# Patient Record
Sex: Female | Born: 1965 | Race: White | Hispanic: No | Marital: Married | Smoking: Former smoker
Health system: Southern US, Community
[De-identification: ages and names within clinical notes are randomized; demographics above are authoritative.]

## PROBLEM LIST (undated history)

## (undated) DIAGNOSIS — J45909 Unspecified asthma, uncomplicated: Secondary | ICD-10-CM

## (undated) DIAGNOSIS — E119 Type 2 diabetes mellitus without complications: Secondary | ICD-10-CM

## (undated) DIAGNOSIS — K219 Gastro-esophageal reflux disease without esophagitis: Secondary | ICD-10-CM

## (undated) DIAGNOSIS — G43909 Migraine, unspecified, not intractable, without status migrainosus: Secondary | ICD-10-CM

## (undated) DIAGNOSIS — G4730 Sleep apnea, unspecified: Secondary | ICD-10-CM

## (undated) DIAGNOSIS — N809 Endometriosis, unspecified: Secondary | ICD-10-CM

## (undated) DIAGNOSIS — I10 Essential (primary) hypertension: Secondary | ICD-10-CM

## (undated) DIAGNOSIS — D649 Anemia, unspecified: Secondary | ICD-10-CM

---
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
## 1994-10-15 HISTORY — PX: TUBAL LIGATION: SHX77

## 2003-10-16 HISTORY — PX: CHOLECYSTECTOMY: SHX55

## 2004-03-02 ENCOUNTER — Other Ambulatory Visit: Payer: Self-pay

## 2005-03-01 ENCOUNTER — Ambulatory Visit: Payer: Self-pay

## 2005-05-17 ENCOUNTER — Emergency Department: Payer: Self-pay | Admitting: Emergency Medicine

## 2005-06-28 ENCOUNTER — Ambulatory Visit: Payer: Self-pay

## 2009-01-02 ENCOUNTER — Ambulatory Visit: Payer: Self-pay | Admitting: Internal Medicine

## 2011-11-07 ENCOUNTER — Ambulatory Visit: Payer: Self-pay

## 2011-11-07 LAB — RAPID STREP-A WITH REFLX: Micro Text Report: NEGATIVE

## 2011-11-09 LAB — BETA STREP CULTURE(ARMC)

## 2012-12-13 ENCOUNTER — Ambulatory Visit: Payer: Self-pay | Admitting: Family Medicine

## 2013-10-15 HISTORY — PX: LAPAROSCOPIC GASTRIC BANDING: SHX1100

## 2014-02-25 ENCOUNTER — Ambulatory Visit: Payer: Self-pay | Admitting: Specialist

## 2015-01-11 IMAGING — CR DG CHEST 2V
1 series · 2 of 2 positions shown · non-contrast
Comparison: None.

CLINICAL DATA: Bariatric workup, obesity

EXAM:
CHEST  2 VIEW

[Series 2: w chest pa · 0.14mm/px · 2 of 2 slices shown]
[im 1/2]
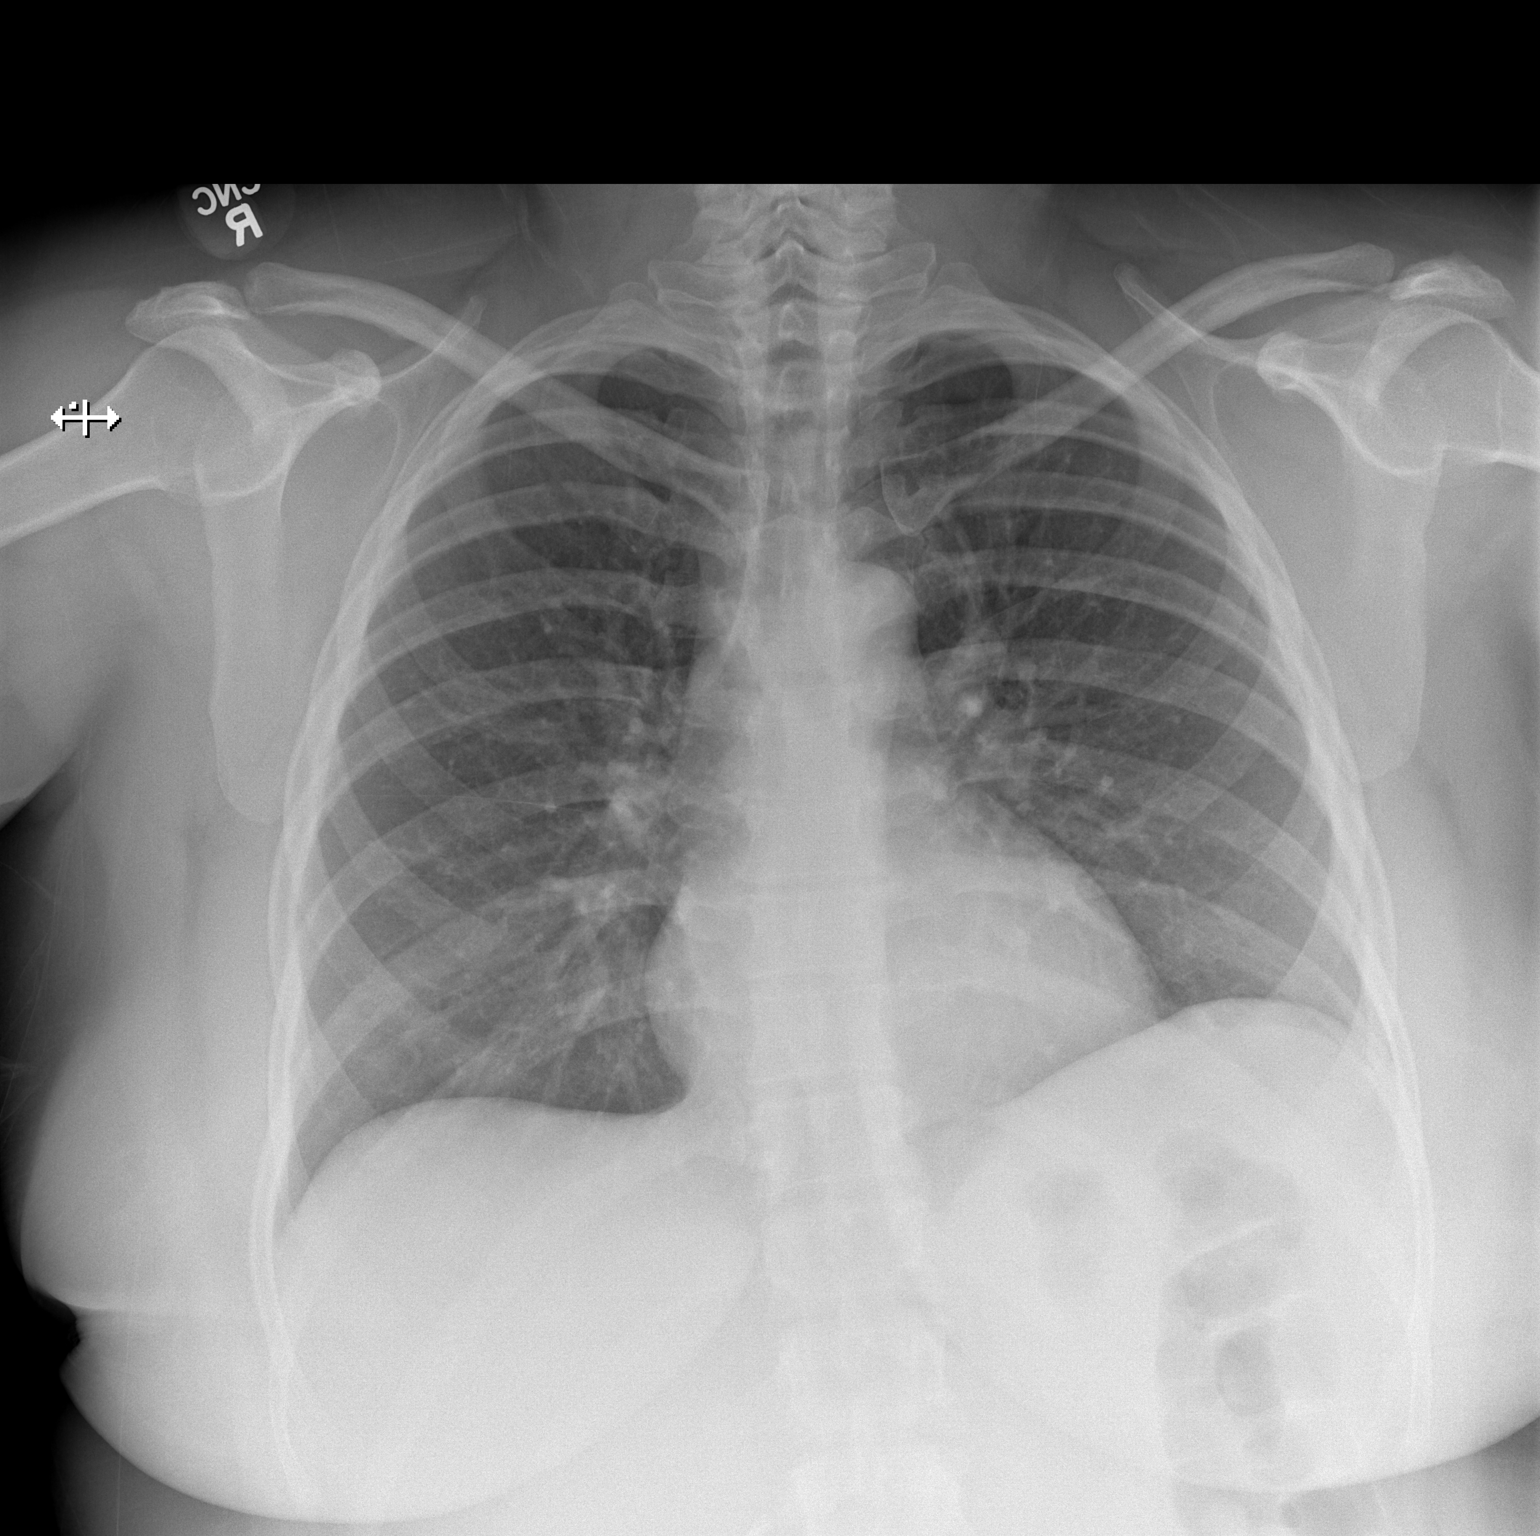
[im 2/2]
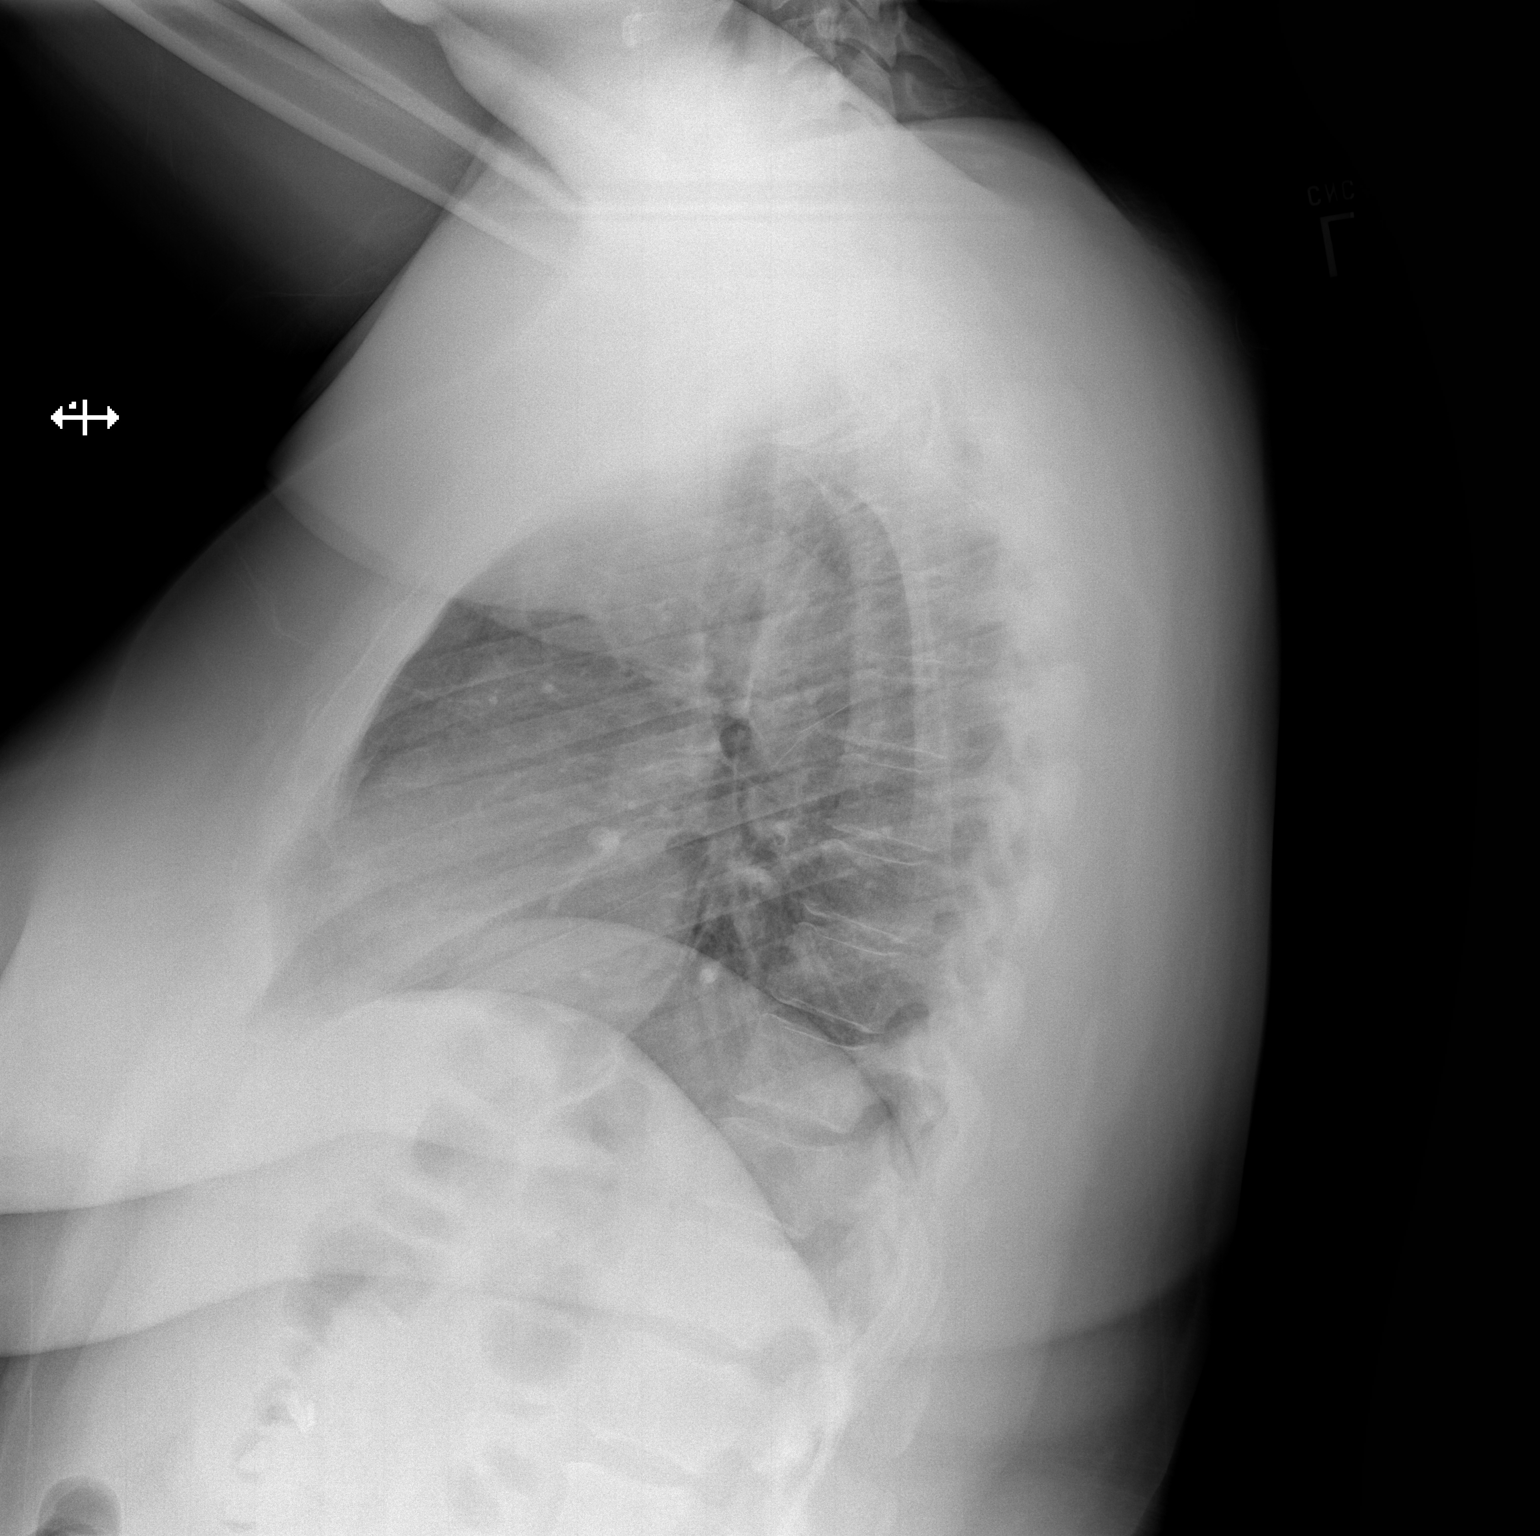

[2 of 2 positions shown; findings below may reference images not displayed]

FINDINGS: Cardiomediastinal silhouette is unremarkable. No acute infiltrate or
pleural effusion. No pulmonary edema. Bony thorax is unremarkable.
IMPRESSION: No active cardiopulmonary disease.

## 2016-03-31 ENCOUNTER — Ambulatory Visit
Admission: EM | Admit: 2016-03-31 | Discharge: 2016-03-31 | Disposition: A | Discharge status: Home / Self Care | Payer: BLUE CROSS/BLUE SHIELD | Attending: Family Medicine | Admitting: Family Medicine

## 2016-03-31 ENCOUNTER — Encounter: Payer: Self-pay | Admitting: *Deleted

## 2016-03-31 DIAGNOSIS — N390 Urinary tract infection, site not specified: Secondary | ICD-10-CM

## 2016-03-31 DIAGNOSIS — R319 Hematuria, unspecified: Secondary | ICD-10-CM | POA: Diagnosis not present

## 2016-03-31 HISTORY — DX: Type 2 diabetes mellitus without complications: E11.9

## 2016-03-31 HISTORY — DX: Endometriosis, unspecified: N80.9

## 2016-03-31 HISTORY — DX: Sleep apnea, unspecified: G47.30

## 2016-03-31 HISTORY — DX: Anemia, unspecified: D64.9

## 2016-03-31 HISTORY — DX: Gastro-esophageal reflux disease without esophagitis: K21.9

## 2016-03-31 HISTORY — DX: Unspecified asthma, uncomplicated: J45.909

## 2016-03-31 HISTORY — DX: Migraine, unspecified, not intractable, without status migrainosus: G43.909

## 2016-03-31 HISTORY — DX: Essential (primary) hypertension: I10

## 2016-03-31 LAB — PREGNANCY, URINE: Preg Test, Ur: NEGATIVE

## 2016-03-31 LAB — URINALYSIS COMPLETE WITH MICROSCOPIC (ARMC ONLY)
BILIRUBIN URINE: NEGATIVE
GLUCOSE, UA: NEGATIVE mg/dL
Hgb urine dipstick: NEGATIVE
Ketones, ur: NEGATIVE mg/dL
Leukocytes, UA: NEGATIVE
Nitrite: POSITIVE — AB
Protein, ur: NEGATIVE mg/dL
Specific Gravity, Urine: 1.015 (ref 1.005–1.030)
pH: 6 (ref 5.0–8.0)

## 2016-03-31 MED ORDER — NITROFURANTOIN MONOHYD MACRO 100 MG PO CAPS: 100.0000 mg | ORAL_CAPSULE | Freq: Two times a day (BID) | ORAL | Status: DC

## 2016-03-31 NOTE — ED Notes (Signed)
Patient started having symptoms burning while urinating 3 days ago and this AM blood in urine was observed. Patient does have a history of UTI.

## 2016-03-31 NOTE — ED Provider Notes (Signed)
CSN: [id number]     Arrival date & time 03/31/16  1355 History   First MD Initiated Contact with Patient 03/31/16 1441     Chief Complaint  Patient presents with  . Recurrent UTI   (Consider location/radiation/quality/duration/timing/severity/associated sxs/prior Treatment) HPI: She presents today with symptoms of dysuria for the last 3 days. She states that she was trying to treat her symptoms with cranberry juice and Azo however her symptoms got worse yesterday. She did notice blood in her urine yesterday. She does have a history of UTIs. She also has had a history of a kidney stone a few years ago. She denies any vaginal bleeding or discharge. Her last menstrual period was June 1. She denies any fever, nausea or vomiting.  Past Medical History  Diagnosis Date  . GERD (gastroesophageal reflux disease)   . Hypertension   . Diabetes mellitus without complication (HCC)   . Anemia   . Sleep apnea   . Migraine   . Asthma   . Endometriosis    Past Surgical History  Procedure Laterality Date  . Laparoscopic gastric banding     Family History  Problem Relation Age of Onset  . Rheum arthritis Mother   . Diabetes Father   . Hypertension Father    Social History  Substance Use Topics  . Smoking status: Never Smoker   . Smokeless tobacco: Never Used  . Alcohol Use: Yes   OB History    No data available     Review of Systems: Negative except mentioned above.  Allergies  Avelox; Penicillins; and Sulfa antibiotics  Home Medications   Prior to Admission medications   Medication Sig Start Date End Date Taking? Authorizing Provider  albuterol (PROVENTIL HFA;VENTOLIN HFA) 108 (90 Base) MCG/ACT inhaler Inhale 1 puff into the lungs every 6 (six) hours as needed for wheezing or shortness of breath.   Yes Historical Provider, MD  budesonide (RHINOCORT ALLERGY) 32 MCG/ACT nasal spray Place 1 spray into both nostrils daily.   Yes Historical Provider, MD  Cholecalciferol (VITAMIN D PO)  Take 1 tablet by mouth daily.   Yes Historical Provider, MD  esomeprazole (NEXIUM) 20 MG capsule Take 20 mg by mouth daily at 12 noon.   Yes Historical Provider, MD  Multiple Vitamin (MULTIVITAMIN) tablet Take 1 tablet by mouth daily.   Yes Historical Provider, MD   Meds Ordered and Administered this Visit  Medications - No data to display  BP 136/70 mmHg  Pulse 99  Temp(Src) 98.3 F (36.8 C)  Resp 18  Ht 5\' 4"  (1.626 m)  Wt 190 lb (86.183 kg)  BMI 32.60 kg/m2  SpO2 99%  LMP 03/15/2016 No data found.   Physical Exam   GENERAL: NAD HEENT: no pharyngeal erythema, no exudate RESP: CTA B CARD: RRR ABD: +BS, mild suprapubic tenderness, minimal right flank tenderness, no rebound or guarding  NEURO: CN II-XII grossly intact   ED Course  Procedures (including critical care time)  Labs Review Labs Reviewed  URINALYSIS COMPLETEWITH MICROSCOPIC [facility] ONLY)    Imaging Review No results found.     MDM  A/P: UTI- symptoms are suggestive of UTI, UA only shows positive nitrites, however patient has described hematuria that she has noticed. We'll send urine for culture. Urine pregnancy was negative. I have discussed with the patient that there is a possibility that she could've passed a kidney stone. If her symptoms do persist or worsen as described to her she should go to the ER.  Jolene Provost, MD 03/31/16 [phone]

## 2016-05-06 ENCOUNTER — Ambulatory Visit
Admission: EM | Admit: 2016-05-06 | Discharge: 2016-05-06 | Disposition: A | Discharge status: Home / Self Care | Payer: BLUE CROSS/BLUE SHIELD | Attending: Family Medicine | Admitting: Family Medicine

## 2016-05-06 DIAGNOSIS — M25551 Pain in right hip: Secondary | ICD-10-CM | POA: Diagnosis not present

## 2016-05-06 DIAGNOSIS — M76891 Other specified enthesopathies of right lower limb, excluding foot: Secondary | ICD-10-CM

## 2016-05-06 NOTE — ED Triage Notes (Signed)
Patient states that she has right hip pain with a palpable knot on her hip. Patient states that she noticed the knot last night when she was stretching. Patient states that the pain has been constant for 1 year, worsening over the last 3-4 days.

## 2016-05-06 NOTE — ED Provider Notes (Signed)
[facility]    CSN: [id number] Arrival date & time: 05/06/16  1415  First Provider Contact:  First MD Initiated Contact with Patient 05/06/16 1512        History   Chief Complaint Chief Complaint  Patient presents with  . Hip Pain    HPI Donna Collier is a 50 y.o. female.   50 yo female with a  1 year h/o intermittent right hip pain presents with worsening over the last week. Denies any injuries, fall, trauma,  swelling, fevers, chills, bowel or bladder problems. States pain occasionally radiates into the right groin area. Worse with movement intermittently.    The history is provided by the patient.  Hip Pain  This is a chronic problem. The current episode started more than 1 week ago. The problem has been gradually worsening. Pertinent negatives include no chest pain, no abdominal pain and no shortness of breath. The symptoms are aggravated by walking. The symptoms are relieved by rest, ice and NSAIDs.    Past Medical History:  Diagnosis Date  . Anemia   . Asthma   . Diabetes mellitus without complication (HCC)   . Endometriosis   . GERD (gastroesophageal reflux disease)   . Hypertension   . Migraine   . Sleep apnea     There are no active problems to display for this patient.   Past Surgical History:  Procedure Laterality Date  . APPENDECTOMY  1974  . CESAREAN SECTION  1991  . CHOLECYSTECTOMY  2005  . LAPAROSCOPIC GASTRIC BANDING  2015  . TUBAL LIGATION  1996    OB History    No data available       Home Medications    Prior to Admission medications   Medication Sig Start Date End Date Taking? Authorizing Provider  albuterol (PROVENTIL HFA;VENTOLIN HFA) 108 (90 Base) MCG/ACT inhaler Inhale 1 puff into the lungs every 6 (six) hours as needed for wheezing or shortness of breath.   Yes Historical Provider, MD  Biotin 10 MG CAPS Take by mouth.   Yes Historical Provider, MD  budesonide (RHINOCORT ALLERGY) 32 MCG/ACT nasal spray Place 1 spray  into both nostrils daily.   Yes Historical Provider, MD  Cholecalciferol (VITAMIN D PO) Take 1 tablet by mouth daily.   Yes Historical Provider, MD  esomeprazole (NEXIUM) 20 MG capsule Take 20 mg by mouth daily at 12 noon.   Yes Historical Provider, MD  Multiple Vitamin (MULTIVITAMIN) tablet Take 1 tablet by mouth daily.   Yes Historical Provider, MD  vitamin B-12 (CYANOCOBALAMIN) 1000 MCG tablet Take 1,000 mcg by mouth daily.   Yes Historical Provider, MD    Family History Family History  Problem Relation Age of Onset  . Rheum arthritis Mother   . Diabetes Father   . Hypertension Father     Social History Social History  Substance Use Topics  . Smoking status: Never Smoker  . Smokeless tobacco: Never Used  . Alcohol use Yes     Allergies   Avelox [moxifloxacin]; Penicillins; Septra [sulfamethoxazole-trimethoprim]; and Sulfa antibiotics   Review of Systems Review of Systems  Respiratory: Negative for shortness of breath.   Cardiovascular: Negative for chest pain.  Gastrointestinal: Negative for abdominal pain.     Physical Exam Triage Vital Signs ED Triage Vitals  Enc Vitals Group     BP 05/06/16 1434 130/86     Pulse Rate 05/06/16 1434 94     Resp 05/06/16 1434 16     Temp  05/06/16 1434 99.6 F (37.6 C)     Temp Source 05/06/16 1434 Tympanic     SpO2 05/06/16 1434 100 %     Weight 05/06/16 1434 193 lb (87.5 kg)     Height 05/06/16 1434 5\' 4"  (1.626 m)     Head Circumference --      Peak Flow --      Pain Score 05/06/16 1439 8     Pain Loc --      Pain Edu? --      Excl. in GC? --    No data found.   Updated Vital Signs BP 130/86 (BP Location: Left Arm)   Pulse 94   Temp 99.6 F (37.6 C) (Tympanic)   Resp 16   Ht 5\' 4"  (1.626 m)   Wt 193 lb (87.5 kg)   LMP 04/16/2016 (Approximate)   SpO2 100%   BMI 33.13 kg/m   Visual Acuity Right Eye Distance:   Left Eye Distance:   Bilateral Distance:    Right Eye Near:   Left Eye Near:    Bilateral  Near:     Physical Exam  Constitutional: She appears well-developed and well-nourished. No distress.  Musculoskeletal: Normal range of motion.       Right hip: She exhibits tenderness (over the tensor fasciae latae muscle) and bony tenderness (over the anterior superior iliac spine). She exhibits normal range of motion, normal strength, no swelling, no crepitus, no deformity and no laceration.       Legs: Skin: She is not diaphoretic.  Nursing note and vitals reviewed.    UC Treatments / Results  Labs (all labs ordered are listed, but only abnormal results are displayed) Labs Reviewed - No data to display  EKG  EKG Interpretation None       Radiology No results found.  Procedures Procedures (including critical care time)  Medications Ordered in UC Medications - No data to display   Initial Impression / Assessment and Plan / UC Course  I have reviewed the triage vital signs and the nursing notes.  Pertinent labs & imaging results that were available during my care of the patient were reviewed by me and considered in my medical decision making (see chart for details).  Clinical Course      Final Clinical Impressions(s) / UC Diagnoses   Final diagnoses:  Hip pain, right  Hip tendonitis, right    New Prescriptions Discharge Medication List as of 05/06/2016  3:34 PM     1. diagnosis reviewed with patient 2. Recommend supportive treatment with otc nsaids, stretching, ice 3. Discussed with patient may benefit from physical therapy and recommended patient discuss this with her PCP 4. Follow-up prn if symptoms worsen or don't improve   Payton Mccallum, MD 05/06/16 1750

## 2016-09-17 ENCOUNTER — Ambulatory Visit
Admission: EM | Admit: 2016-09-17 | Discharge: 2016-09-17 | Disposition: A | Discharge status: Home / Self Care | Payer: BLUE CROSS/BLUE SHIELD | Attending: Family Medicine | Admitting: Family Medicine

## 2016-09-17 DIAGNOSIS — J40 Bronchitis, not specified as acute or chronic: Secondary | ICD-10-CM

## 2016-09-17 MED ORDER — AZITHROMYCIN 250 MG PO TABS: ORAL_TABLET | ORAL | 0 refills | Status: DC

## 2016-09-17 MED ORDER — PREDNISONE 20 MG PO TABS: 40.0000 mg | ORAL_TABLET | Freq: Every day | ORAL | 0 refills | Status: AC

## 2016-09-17 MED ORDER — ALBUTEROL SULFATE HFA 108 (90 BASE) MCG/ACT IN AERS: 2.0000 | INHALATION_SPRAY | RESPIRATORY_TRACT | 0 refills | Status: DC | PRN

## 2016-09-17 NOTE — ED Provider Notes (Signed)
[facility] ____________________________________________  Time seen: Approximately 7:07 PM  I have reviewed the triage vital signs and the nursing notes.   HISTORY Chief Complaint  Cough  HPI Donna Collier is a 50 y.o. female present for the complaints of 5-6 days of runny nose, nasal congestion, cough and chest congestion. Patient reports she feels like he has chest condition is increased. Patient reports that she has a history of developing bronchitis and pneumonia which prompted her to be evaluated. She reports some chills and body aches. Denies fevers. States feels like she can occasionally hear herself wheeze when coughing. Reports history of asthma and seasonal allergies. Reports some sick contacts recently at work. Denies known sick contacts.  Reports continues to eat and drink well. Denies fevers. Denies chest pain, shortness breath, chest pain with deep breath, abdominal pain, dysuria, extremity pain or extremity swelling. Denies recent antibiotic use.  Patient's last menstrual period was 09/17/2016. Denies pregnancy.   Past Medical History:  Diagnosis Date  . Anemia   . Asthma   . Diabetes mellitus without complication (HCC)   . Endometriosis   . GERD (gastroesophageal reflux disease)   . Hypertension   . Migraine   . Sleep apnea     There are no active problems to display for this patient.   Past Surgical History:  Procedure Laterality Date  . APPENDECTOMY  1974  . CESAREAN SECTION  1991  . CHOLECYSTECTOMY  2005  . LAPAROSCOPIC GASTRIC BANDING  2015  . TUBAL LIGATION  1996    No current facility-administered medications for this encounter.   Current Outpatient Prescriptions:  .  Biotin 10 MG CAPS, Take by mouth., Disp: , Rfl:  .  budesonide (RHINOCORT ALLERGY) 32 MCG/ACT nasal spray, Place 1 spray into both nostrils daily., Disp: , Rfl:  .  Cholecalciferol (VITAMIN D PO), Take 1 tablet by mouth daily., Disp: , Rfl:  .  esomeprazole (NEXIUM) 20  MG capsule, Take 20 mg by mouth daily at 12 noon., Disp: , Rfl:  .  Multiple Vitamin (MULTIVITAMIN) tablet, Take 1 tablet by mouth daily., Disp: , Rfl:  .  vitamin B-12 (CYANOCOBALAMIN) 1000 MCG tablet, Take 1,000 mcg by mouth daily., Disp: , Rfl:  .  albuterol (PROVENTIL HFA;VENTOLIN HFA) 108 (90 Base) MCG/ACT inhaler, Inhale 2 puffs into the lungs every 4 (four) hours as needed., Disp: 1 Inhaler, Rfl: 0 .  azithromycin (ZITHROMAX Z-PAK) 250 MG tablet, Take 2 tablets (500 mg) on  Day 1,  followed by 1 tablet (250 mg) once daily on Days 2 through 5., Disp: 6 each, Rfl: 0 .  predniSONE (DELTASONE) 20 MG tablet, Take 2 tablets (40 mg total) by mouth daily., Disp: 6 tablet, Rfl: 0  Allergies Avelox [moxifloxacin]; Penicillins; Septra [sulfamethoxazole-trimethoprim]; and Sulfa antibiotics  Family History  Problem Relation Age of Onset  . Rheum arthritis Mother   . Diabetes Father   . Hypertension Father     Social History Social History  Substance Use Topics  . Smoking status: Never Smoker  . Smokeless tobacco: Never Used  . Alcohol use Yes    Review of Systems Constitutional: As above.  Eyes: No visual changes. ENT: Mild sore throat. Cardiovascular: Denies chest pain. Respiratory: Denies shortness of breath. Gastrointestinal: No abdominal pain.  No nausea, no vomiting.  No diarrhea.  No constipation. Genitourinary: Negative for dysuria. Musculoskeletal: Negative for back pain. Skin: Negative for rash. Neurological: Negative for headaches, focal weakness or numbness.  10-point ROS otherwise negative.  ____________________________________________  PHYSICAL EXAM:  VITAL SIGNS: ED Triage Vitals  Enc Vitals Group     BP 09/17/16 1840 (!) 151/91     Pulse Rate 09/17/16 1840 90     Resp 09/17/16 1840 18     Temp 09/17/16 1840 98 F (36.7 C)     Temp Source 09/17/16 1840 Oral     SpO2 09/17/16 1840 99 %     Weight 09/17/16 1839 191 lb (86.6 kg)     Height 09/17/16 1839 5'  4" (1.626 m)     Head Circumference --      Peak Flow --      Pain Score 09/17/16 1841 9     Pain Loc --      Pain Edu? --      Excl. in GC? --    Constitutional: Alert and oriented. Well appearing and in no acute distress. Eyes: Conjunctivae are normal. PERRL. EOMI. Head: Atraumatic. No sinus tenderness to palpation. No swelling. No erythema.  Ears: no erythema, normal TMs bilaterally.   Nose:Nasal congestion with clear rhinorrhea  Mouth/Throat: Mucous membranes are moist. Mild pharyngeal erythema. No tonsillar swelling or exudate.  Neck: No stridor.  No cervical spine tenderness to palpation. Hematological/Lymphatic/Immunilogical: No cervical lymphadenopathy. Cardiovascular: Normal rate, regular rhythm. Grossly normal heart sounds.  Good peripheral circulation. Respiratory: Normal respiratory effort.  No retractions. Lungs CTAB. No wheezes, rales or rhonchi. Good air movement. Intermittent cough noted in room. Mild bronchospasm noted with cough. Gastrointestinal: Soft and nontender.  No CVA tenderness. Musculoskeletal: No lower or upper extremity tenderness nor edema. No cervical, thoracic or lumbar tenderness to palpation. Neurologic:  Normal speech and language. No gross focal neurologic deficits are appreciated. No gait instability. Skin:  Skin is warm, dry and intact. No rash noted. Psychiatric: Mood and affect are normal. Speech and behavior are normal. ________________________________________   LABS (all labs ordered are listed, but only abnormal results are displayed)  Labs Reviewed - No data to display  RADIOLOGY  No results found. ____________________________________________   PROCEDURES Procedures    INITIAL IMPRESSION / ASSESSMENT AND PLAN / ED COURSE  Pertinent labs & imaging results that were available during my care of the patient were reviewed by me and considered in my medical decision making (see chart for details).  Well-appearing patient. No acute  distress. Suspect bronchitis. Will treat patient with oral azithromycin, 3 day course of prednisone, when necessary albuterol inhaler. Patient states that she is cough medicine at home. Encouraged rest, fluids supportive care and PCP follow-up.Discussed indication, risks and benefits of medications with patient.  Discussed follow up with Primary care physician this week. Discussed follow up and return parameters including no resolution or any worsening concerns. Patient verbalized understanding and agreed to plan.   ____________________________________________   FINAL CLINICAL IMPRESSION(S) / ED DIAGNOSES  Final diagnoses:  Bronchitis     Discharge Medication List as of 09/17/2016  7:14 PM    START taking these medications   Details  azithromycin (ZITHROMAX Z-PAK) 250 MG tablet Take 2 tablets (500 mg) on  Day 1,  followed by 1 tablet (250 mg) once daily on Days 2 through 5., Normal    predniSONE (DELTASONE) 20 MG tablet Take 2 tablets (40 mg total) by mouth daily., Starting Mon 09/17/2016, Until Thu 09/20/2016, Normal        Note: This dictation was prepared with Dragon dictation along with smaller phrase technology. Any transcriptional errors that result from this process are unintentional.    Clinical Course  Renford DillsLindsey Sanav Remer, NP 09/17/16 2024

## 2016-09-17 NOTE — Discharge Instructions (Signed)
Take medication as prescribed. Rest. Drink plenty of fluids.  ° °Follow up with your primary care physician this week as needed. Return to Urgent care for new or worsening concerns.  ° °

## 2016-09-17 NOTE — ED Triage Notes (Signed)
Patient complains of cough and congestion x 5 days. Patient states that she has been trying mucinex d without relief and inhalers. Patient states that she has been continually worsening.

## 2016-09-20 ENCOUNTER — Telehealth: Payer: Self-pay

## 2016-09-20 NOTE — Telephone Encounter (Signed)
Follow up call, no answer and no voicemail available. Will try again later.

## 2017-07-11 ENCOUNTER — Ambulatory Visit
Admission: EM | Admit: 2017-07-11 | Discharge: 2017-07-11 | Disposition: A | Discharge status: Home / Self Care | Payer: BLUE CROSS/BLUE SHIELD | Attending: Family Medicine | Admitting: Family Medicine

## 2017-07-11 DIAGNOSIS — R05 Cough: Secondary | ICD-10-CM

## 2017-07-11 DIAGNOSIS — J028 Acute pharyngitis due to other specified organisms: Secondary | ICD-10-CM | POA: Diagnosis not present

## 2017-07-11 DIAGNOSIS — J0191 Acute recurrent sinusitis, unspecified: Secondary | ICD-10-CM

## 2017-07-11 LAB — RAPID STREP SCREEN (MED CTR MEBANE ONLY): STREPTOCOCCUS, GROUP A SCREEN (DIRECT): NEGATIVE

## 2017-07-11 MED ORDER — FEXOFENADINE-PSEUDOEPHED ER 180-240 MG PO TB24: 1.0000 | ORAL_TABLET | Freq: Every day | ORAL | 0 refills | Status: DC

## 2017-07-11 MED ORDER — BENZONATATE 200 MG PO CAPS: 200.0000 mg | ORAL_CAPSULE | Freq: Three times a day (TID) | ORAL | 0 refills | Status: DC | PRN

## 2017-07-11 MED ORDER — AZITHROMYCIN 250 MG PO TABS: ORAL_TABLET | ORAL | 0 refills | Status: DC

## 2017-07-11 NOTE — ED Provider Notes (Signed)
[facility]    CSN: [id number] Arrival date & time: 07/11/17  5284     History   Chief Complaint Chief Complaint  Patient presents with  . Sore Throat  . Otalgia    HPI Donna Collier is a 51 y.o. female.   51 year old white female is here because of a sore throat. Along with sore throat that started yesterday and got worse reports having postnasal drainage and congestion and cough for over a week. She states that last week on the 18th Flu shot by that Thursday she had a running nose and nasal congestion but it wasn't too bad until yesterday where they start becoming sore start coughing up yellowish-green phlegm as well. No fever no really aching all over. Past smoking history she's had gastric bypass/sleeve surgery, tubal ligation cholecystectomy appendectomy and C-section. She's had a history of anemia asthma diabetes GERD hypertension and migraines and sleep apnea. Father with diabetes and hypertension mother rheumatoid arthritis and she is a former smoker. She is allergic to Avelox she thinks she can take Levaquin penicillins and sulfa drugs.   The history is provided by the patient. No language interpreter was used.  Sore Throat  This is a new problem. The current episode started yesterday. The problem occurs constantly. The problem has been rapidly worsening. Pertinent negatives include no chest pain, no abdominal pain, no headaches and no shortness of breath. Nothing aggravates the symptoms. Nothing relieves the symptoms. She has tried nothing for the symptoms. The treatment provided no relief.  Otalgia  Associated symptoms: cough, rhinorrhea and sore throat   Associated symptoms: no abdominal pain and no headaches     Past Medical History:  Diagnosis Date  . Anemia   . Asthma   . Diabetes mellitus without complication (HCC)   . Endometriosis   . GERD (gastroesophageal reflux disease)   . Hypertension   . Migraine   . Sleep apnea     There are no active  problems to display for this patient.   Past Surgical History:  Procedure Laterality Date  . APPENDECTOMY  1974  . CESAREAN SECTION  1991  . CHOLECYSTECTOMY  2005  . LAPAROSCOPIC GASTRIC BANDING  2015  . TUBAL LIGATION  1996    OB History    No data available       Home Medications    Prior to Admission medications   Medication Sig Start Date End Date Taking? Authorizing Provider  eletriptan (RELPAX) 40 MG tablet Take 40 mg by mouth as needed for migraine or headache. May repeat in 2 hours if headache persists or recurs.   Yes [provider]  loratadine (CLARITIN) 10 MG tablet Take 10 mg by mouth daily.   Yes [provider]  albuterol (PROVENTIL HFA;VENTOLIN HFA) 108 (90 Base) MCG/ACT inhaler Inhale 2 puffs into the lungs every 4 (four) hours as needed. 09/17/16   Renford Dills, NP  azithromycin (ZITHROMAX Z-PAK) 250 MG tablet Take 2 tablets (500 mg) on  Day 1,  followed by 1 tablet (250 mg) once daily on Days 2 through 5. 09/17/16   Renford Dills, NP  azithromycin (ZITHROMAX Z-PAK) 250 MG tablet Take 2 tablets first day and then 1 po a day for 4 days 07/11/17   Hassan Rowan, MD  benzonatate (TESSALON) 200 MG capsule Take 1 capsule (200 mg total) by mouth 3 (three) times daily as needed. 07/11/17   Hassan Rowan, MD  Biotin 10 MG CAPS Take by mouth.  [provider]  budesonide (RHINOCORT ALLERGY) 32 MCG/ACT nasal spray Place 1 spray into both nostrils daily.    [provider]  Cholecalciferol (VITAMIN D PO) Take 1 tablet by mouth daily.    [provider]  esomeprazole (NEXIUM) 20 MG capsule Take 20 mg by mouth daily at 12 noon.    [provider]  Multiple Vitamin (MULTIVITAMIN) tablet Take 1 tablet by mouth daily.    [provider]  vitamin B-12 (CYANOCOBALAMIN) 1000 MCG tablet Take 1,000 mcg by mouth daily.    [provider]    Family History Family History  Problem Relation Age of Onset  . Rheum  arthritis Mother   . Diabetes Father   . Hypertension Father     Social History Social History  Substance Use Topics  . Smoking status: Former Smoker    Quit date: 07/12/1999  . Smokeless tobacco: Never Used  . Alcohol use Yes     Comment: rare     Allergies   Avelox [moxifloxacin]; Penicillins; Septra [sulfamethoxazole-trimethoprim]; and Sulfa antibiotics   Review of Systems Review of Systems  HENT: Positive for ear pain, rhinorrhea, sinus pain and sore throat.   Respiratory: Positive for cough. Negative for shortness of breath.   Cardiovascular: Negative for chest pain.  Gastrointestinal: Negative for abdominal pain.  Neurological: Negative for headaches.  All other systems reviewed and are negative.    Physical Exam Triage Vital Signs ED Triage Vitals [07/11/17 0857]  Enc Vitals Group     BP 131/80     Pulse Rate 91     Resp 18     Temp 98.7 F (37.1 C)     Temp Source Oral     SpO2 100 %     Weight 210 lb (95.3 kg)     Height  (1.651 m)     Head Circumference      Peak Flow      Pain Score 5     Pain Loc      Pain Edu?      Excl. in GC?    No data found.   Updated Vital Signs BP 131/80 (BP Location: Left Arm)   Pulse 91   Temp 98.7 F (37.1 C) (Oral)   Resp 18   Ht  (1.651 m)   Wt 210 lb (95.3 kg)   LMP 02/08/2017   SpO2 100%   BMI 34.95 kg/m   Visual Acuity Right Eye Distance:   Left Eye Distance:   Bilateral Distance:    Right Eye Near:   Left Eye Near:    Bilateral Near:     Physical Exam  Constitutional: She is oriented to person, place, and time. She appears well-developed and well-nourished.  HENT:  Head: Normocephalic and atraumatic.  Right Ear: External ear normal.  Left Ear: External ear normal.  Mouth/Throat: Oropharynx is clear and moist.  Eyes: Pupils are equal, round, and reactive to light.  Neck: Normal range of motion. Neck supple.  Cardiovascular: Normal rate, regular rhythm and normal heart sounds.     Pulmonary/Chest: Effort normal.  Abdominal: Soft.  Musculoskeletal: Normal range of motion.  Lymphadenopathy:    She has cervical adenopathy.  Neurological: She is alert and oriented to person, place, and time.  Skin: Skin is warm. She is not diaphoretic.  Psychiatric: She has a normal mood and affect.  Vitals reviewed.    UC Treatments / Results  Labs (all labs ordered are listed, but only  abnormal results are displayed) Labs Reviewed  RAPID STREP SCREEN (NOT AT [facility])  CULTURE, GROUP A STREP [facility])    EKG  EKG Interpretation None       Radiology No results found.  Procedures Procedures (including critical care time)  Medications Ordered in UC Medications - No data to display  Results for orders placed or performed during the hospital encounter of 07/11/17  Rapid strep screen  Result Value Ref Range   Streptococcus, Group A Screen (Direct) NEGATIVE NEGATIVE   Initial Impression / Assessment and Plan / UC Course  I have reviewed the triage vital signs and the nursing notes.  Pertinent labs & imaging results that were available during my care of the patient were reviewed by me and considered in my medical decision making (see chart for details).   patient since she had symptoms now for over a week and things gotten worse will place on Z-Pak Tessalon Perle. Allegra-D work note for tomorrow as well. Follow-up for PCP as needed  Final Clinical Impressions(s) / UC Diagnoses   Final diagnoses:  Pharyngitis due to other organism  Acute recurrent sinusitis, unspecified location    New Prescriptions New Prescriptions   AZITHROMYCIN (ZITHROMAX Z-PAK) 250 MG TABLET    Take 2 tablets first day and then 1 po a day for 4 days   BENZONATATE (TESSALON) 200 MG CAPSULE    Take 1 capsule (200 mg total) by mouth 3 (three) times daily as needed.   Note: This dictation was prepared with Dragon dictation along with smaller phrase technology. Any transcriptional errors that  result from this process are unintentional.  Controlled Substance Prescriptions [place] Controlled Substance Registry consulted? Not Applicable   Hassan Rowan, MD 07/11/17 1025

## 2017-07-11 NOTE — ED Triage Notes (Signed)
Pt reports starting yesterday with bilateral ear pain, sore throat, nasal drainage, and cough. No fever. Had flu shot last week. Also has laryngitis.

## 2017-07-14 LAB — CULTURE, GROUP A STREP (THRC)

## 2017-12-03 ENCOUNTER — Ambulatory Visit
Admission: EM | Admit: 2017-12-03 | Discharge: 2017-12-03 | Disposition: A | Discharge status: Home / Self Care | Payer: BLUE CROSS/BLUE SHIELD | Attending: Emergency Medicine | Admitting: Emergency Medicine

## 2017-12-03 ENCOUNTER — Encounter: Payer: Self-pay | Admitting: Emergency Medicine

## 2017-12-03 ENCOUNTER — Other Ambulatory Visit: Payer: Self-pay

## 2017-12-03 DIAGNOSIS — N3000 Acute cystitis without hematuria: Secondary | ICD-10-CM

## 2017-12-03 LAB — URINALYSIS, COMPLETE (UACMP) WITH MICROSCOPIC
Bilirubin Urine: NEGATIVE
Glucose, UA: NEGATIVE mg/dL
Ketones, ur: NEGATIVE mg/dL
Nitrite: NEGATIVE
Protein, ur: NEGATIVE mg/dL
Specific Gravity, Urine: <1.005 — ABNORMAL LOW (ref 1.005–1.030)
pH: 6 (ref 5.0–8.0)

## 2017-12-03 MED ORDER — PHENAZOPYRIDINE HCL 200 MG PO TABS: 200.0000 mg | ORAL_TABLET | Freq: Three times a day (TID) | ORAL | 0 refills | Status: DC

## 2017-12-03 MED ORDER — NITROFURANTOIN MONOHYD MACRO 100 MG PO CAPS: 100.0000 mg | ORAL_CAPSULE | Freq: Two times a day (BID) | ORAL | 0 refills | Status: DC

## 2017-12-03 NOTE — ED Triage Notes (Addendum)
Patient in today c/o dysuria x 4 day and urinary frequency. Patient had chills yesterday, but denies fever.

## 2017-12-03 NOTE — ED Provider Notes (Signed)
[facility]    CSN: [id number] Arrival date & time: 12/03/17  1341     History   Chief Complaint Chief Complaint  Patient presents with  . Dysuria    HPI Donna Collier is a 52 y.o. female.   HPI  52 year old female presents with dysuria for 4 days along with urinary frequency.  Had chills yesterday but has not had any fevers.  Denies any nausea or vomiting.  She has mild low back pain present.  Thinking that increased water would help with the symptoms but this is not been the case.  The past with previous UTIs she has used Macrobid with good success.  Allergic to Avelox penicillin and Septra.  He has a occasional white vaginal discharge which is normal for her.        Past Medical History:  Diagnosis Date  . Anemia   . Asthma   . Diabetes mellitus without complication (HCC)   . Endometriosis   . GERD (gastroesophageal reflux disease)   . Hypertension   . Migraine   . Sleep apnea     There are no active problems to display for this patient.   Past Surgical History:  Procedure Laterality Date  . APPENDECTOMY  1974  . CESAREAN SECTION  1991  . CHOLECYSTECTOMY  2005  . LAPAROSCOPIC GASTRIC BANDING  2015  . TUBAL LIGATION  1996    OB History    No data available       Home Medications    Prior to Admission medications   Medication Sig Start Date End Date Taking? Authorizing Provider  buPROPion (WELLBUTRIN XL) 300 MG 24 hr tablet Take 300 mg by mouth daily.   Yes [provider]  Cholecalciferol (VITAMIN D PO) Take 1 tablet by mouth daily.   Yes [provider]  esomeprazole (NEXIUM) 20 MG capsule Take 20 mg by mouth daily at 12 noon.   Yes [provider]  loratadine (CLARITIN) 10 MG tablet Take 10 mg by mouth daily.   Yes [provider]  Lorcaserin HCl ER (BELVIQ XR) 20 MG TB24 Take 1 tablet by mouth daily.   Yes [provider]  Multiple Vitamin (MULTIVITAMIN) tablet Take 1 tablet by mouth  daily.   Yes [provider]  vitamin B-12 (CYANOCOBALAMIN) 1000 MCG tablet Take 1,000 mcg by mouth daily.   Yes [provider]  albuterol (PROVENTIL HFA;VENTOLIN HFA) 108 (90 Base) MCG/ACT inhaler Inhale 2 puffs into the lungs every 4 (four) hours as needed. 09/17/16   Renford Dills, NP  budesonide (RHINOCORT ALLERGY) 32 MCG/ACT nasal spray Place 1 spray into both nostrils daily.    [provider]  eletriptan (RELPAX) 40 MG tablet Take 40 mg by mouth as needed for migraine or headache. May repeat in 2 hours if headache persists or recurs.    [provider]  nitrofurantoin, macrocrystal-monohydrate, (MACROBID) 100 MG capsule Take 1 capsule (100 mg total) by mouth 2 (two) times daily. 12/03/17   Lutricia Feil, PA-C  phenazopyridine (PYRIDIUM) 200 MG tablet Take 1 tablet (200 mg total) by mouth 3 (three) times daily. 12/03/17   Lutricia Feil, PA-C    Family History Family History  Problem Relation Age of Onset  . Rheum arthritis Mother   . Diabetes Father   . Hypertension Father     Social History Social History   Tobacco Use  . Smoking status: Former Smoker    Last attempt to quit: 07/12/1999  Years since quitting: 18.4  . Smokeless tobacco: Never Used  Substance Use Topics  . Alcohol use: Yes    Comment: rare  . Drug use: No     Allergies   Avelox [moxifloxacin]; Penicillins; Septra [sulfamethoxazole-trimethoprim]; and Sulfa antibiotics   Review of Systems Review of Systems  Constitutional: Positive for activity change and chills. Negative for appetite change, fatigue and fever.  Genitourinary: Positive for dysuria, frequency, urgency and vaginal discharge.  All other systems reviewed and are negative.    Physical Exam Triage Vital Signs ED Triage Vitals  Enc Vitals Group     BP 12/03/17 1420 134/88     Pulse Rate 12/03/17 1420 100     Resp 12/03/17 1420 16     Temp 12/03/17 1420 98.3 F (36.8 C)     Temp Source  12/03/17 1420 Oral     SpO2 12/03/17 1420 100 %     Weight 12/03/17 1419 196 lb (88.9 kg)     Height 12/03/17 1419 5\' 4"  (1.626 m)     Head Circumference --      Peak Flow --      Pain Score 12/03/17 1419 10     Pain Loc --      Pain Edu? --      Excl. in GC? --    No data found.  Updated Vital Signs BP 134/88 (BP Location: Left Arm)   Pulse 100   Temp 98.3 F (36.8 C) (Oral)   Resp 16   Ht 5\' 4"  (1.626 m)   Wt 196 lb (88.9 kg)   SpO2 100%   BMI 33.64 kg/m   Visual Acuity Right Eye Distance:   Left Eye Distance:   Bilateral Distance:    Right Eye Near:   Left Eye Near:    Bilateral Near:     Physical Exam  Constitutional: She is oriented to person, place, and time. She appears well-developed and well-nourished. No distress.  HENT:  Head: Normocephalic.  Eyes: Pupils are equal, round, and reactive to light. Right eye exhibits no discharge. Left eye exhibits no discharge.  Neck: Normal range of motion.  Pulmonary/Chest: Effort normal and breath sounds normal.  Abdominal: Soft. Bowel sounds are normal.  Musculoskeletal: Normal range of motion.  Neurological: She is alert and oriented to person, place, and time.  Skin: Skin is warm and dry. She is not diaphoretic.  Psychiatric: She has a normal mood and affect. Her behavior is normal. Judgment and thought content normal.  Nursing note and vitals reviewed.    UC Treatments / Results  Labs (all labs ordered are listed, but only abnormal results are displayed) Labs Reviewed  URINALYSIS, COMPLETE (UACMP) WITH MICROSCOPIC - Abnormal; Notable for the following components:      Result Value   APPearance HAZY (*)    Specific Gravity, Urine <1.005 (*)    Hgb urine dipstick MODERATE (*)    Leukocytes, UA MODERATE (*)    Squamous Epithelial / LPF 0-5 (*)    Bacteria, UA FEW (*)    All other components within normal limits  URINE CULTURE    EKG  EKG Interpretation None       Radiology No results  found.  Procedures Procedures (including critical care time)  Medications Ordered in UC Medications - No data to display   Initial Impression / Assessment and Plan / UC Course  I have reviewed the triage vital signs and the nursing notes.  Pertinent labs & imaging results that  were available during my care of the patient were reviewed by me and considered in my medical decision making (see chart for details).     Plan: 1. Test/x-ray results and diagnosis reviewed with patient 2. rx as per orders; risks, benefits, potential side effects reviewed with patient 3. Recommend supportive treatment with increased hydration.  Cultures will be available in 48 hours.  Meantime  if you run high fevers have nausea vomiting or symptoms are not improving recommend following up with your primary care physician or may return to our clinic 4. F/u prn if symptoms worsen or don't improve   Final Clinical Impressions(s) / UC Diagnoses   Final diagnoses:  Acute cystitis without hematuria    ED Discharge Orders        Ordered    nitrofurantoin, macrocrystal-monohydrate, (MACROBID) 100 MG capsule  2 times daily     12/03/17 1521    phenazopyridine (PYRIDIUM) 200 MG tablet  3 times daily     12/03/17 1521       Controlled Substance Prescriptions [place] Controlled Substance Registry consulted? Not Applicable   Lutricia FeilRoemer, Georgetta Crafton P, PA-C 12/03/17 [id number]

## 2017-12-05 LAB — URINE CULTURE

## 2017-12-08 ENCOUNTER — Telehealth: Payer: Self-pay | Admitting: Internal Medicine

## 2017-12-08 NOTE — Telephone Encounter (Signed)
Pt called back for her lab results and notified her it was negative she said she probably just used the wrong soap and symptoms was gone. If they did come back she would f/u with her pcp.

## 2018-09-10 ENCOUNTER — Ambulatory Visit
Admission: EM | Admit: 2018-09-10 | Discharge: 2018-09-10 | Disposition: A | Discharge status: Home / Self Care | Payer: BLUE CROSS/BLUE SHIELD | Attending: Emergency Medicine | Admitting: Emergency Medicine

## 2018-09-10 ENCOUNTER — Other Ambulatory Visit: Payer: Self-pay

## 2018-09-10 ENCOUNTER — Encounter: Payer: Self-pay | Admitting: Emergency Medicine

## 2018-09-10 DIAGNOSIS — H6982 Other specified disorders of Eustachian tube, left ear: Secondary | ICD-10-CM | POA: Diagnosis not present

## 2018-09-10 DIAGNOSIS — J069 Acute upper respiratory infection, unspecified: Secondary | ICD-10-CM

## 2018-09-10 DIAGNOSIS — J0140 Acute pansinusitis, unspecified: Secondary | ICD-10-CM | POA: Diagnosis not present

## 2018-09-10 LAB — RAPID STREP SCREEN (MED CTR MEBANE ONLY): STREPTOCOCCUS, GROUP A SCREEN (DIRECT): NEGATIVE

## 2018-09-10 MED ORDER — DOXYCYCLINE HYCLATE 100 MG PO CAPS: 100.0000 mg | ORAL_CAPSULE | Freq: Two times a day (BID) | ORAL | 0 refills | Status: AC

## 2018-09-10 MED ORDER — BENZONATATE 200 MG PO CAPS: 200.0000 mg | ORAL_CAPSULE | Freq: Three times a day (TID) | ORAL | 0 refills | Status: DC | PRN

## 2018-09-10 MED ORDER — AEROCHAMBER PLUS MISC: 2 refills | Status: DC

## 2018-09-10 MED ORDER — IBUPROFEN 600 MG PO TABS: 600.0000 mg | ORAL_TABLET | Freq: Four times a day (QID) | ORAL | 0 refills | Status: DC | PRN

## 2018-09-10 MED ORDER — IPRATROPIUM BROMIDE 0.06 % NA SOLN: 2.0000 | Freq: Four times a day (QID) | NASAL | 0 refills | Status: DC

## 2018-09-10 NOTE — ED Triage Notes (Signed)
Patient in today c/o sinus pressure/pain, sore throat, headache x 3 days. Patient denies fever. Patient has tried OTC Allegra D, Aleve and Tylenol without relief.

## 2018-09-10 NOTE — Discharge Instructions (Addendum)
wait-and-see prescription of doxycycline.  discontinue the Claritin and Allegra-D, start Mucinex D, saline nasal irrigation with a Lloyd HugerNeil med rinse and distilled water as often as you want.  Ibuprofen 600 mg combined with 1 g of Tylenol together 3 or 4 times a day.  Do not take the Aleve if you are taking ibuprofen.  2 puffs from your albuterol inhaler using a spacer every 4-6 hours, back off on this as you start to feel better.  Atrovent nasal spray.  Discontinue the Rhinocort.  If not getting better in a day or 2 with these medications, start the doxycycline.  She will follow-up with your primary care physician as needed after finishing the doxycycline if you have not gotten better.

## 2018-09-10 NOTE — ED Provider Notes (Signed)
HPI  SUBJECTIVE:  Donna Collier is a 52 y.o. female who presents with 5 days of white nasal congestion, frontal sinus headache, sinus pain and pressure, postnasal drip, sore throat.  She reports upper dental pain, chills, facial swelling along the nasal bridge and inferior to her eyes starting today.  She reports a left earache starting this morning, states she feels as if there is fluid behind her ears and some wheezing this morning.  She reports shortness of breath.  She states that this feels like a URI and not similar to her allergies.  denies allergy symptoms.  She denies rhinorrhea, fevers, change in hearing, otorrhea.  Her ear pain is not associated with chewing or yawning.  No chest pain, chest tightness, dyspnea on exertion, body aches.  She has tried hot teas, Cepacol, Allegra-D, Rhinocort, Tylenol and Aleve.  Cepacol, Tylenol and Aleve helped.  Symptoms are worse with lying down.  She is not currently using her albuterol inhaler.  She is not on any other medications for her asthma other than the albuterol.  She has a past medical history also of allergies, frequent sinusitis.  No history of diabetes, hypertension.  PMD: P    Past Medical History:  Diagnosis Date  . Anemia   . Asthma   . Diabetes mellitus without complication (HCC)   . Endometriosis   . GERD (gastroesophageal reflux disease)   . Hypertension   . Migraine   . Sleep apnea     Past Surgical History:  Procedure Laterality Date  . APPENDECTOMY  1974  . CESAREAN SECTION  1991  . CHOLECYSTECTOMY  2005  . LAPAROSCOPIC GASTRIC BANDING  2015  . TUBAL LIGATION  1996    Family History  Problem Relation Age of Onset  . Rheum arthritis Mother   . Diabetes Father   . Hypertension Father     Social History   Tobacco Use  . Smoking status: Former Smoker    Last attempt to quit: 07/12/1999    Years since quitting: 19.1  . Smokeless tobacco: Never Used  Substance Use Topics  . Alcohol use: Yes    Comment: rare  .  Drug use: No    No current facility-administered medications for this encounter.   Current Outpatient Medications:  .  albuterol (PROVENTIL HFA;VENTOLIN HFA) 108 (90 Base) MCG/ACT inhaler, Inhale 2 puffs into the lungs every 4 (four) hours as needed., Disp: 1 Inhaler, Rfl: 0 .  buPROPion (WELLBUTRIN XL) 300 MG 24 hr tablet, Take 300 mg by mouth daily., Disp: , Rfl:  .  Cholecalciferol (VITAMIN D PO), Take 1 tablet by mouth daily., Disp: , Rfl:  .  cyclobenzaprine (FLEXERIL) 5 MG tablet, TK 1-2 TS PO BID AS NEEDED FOR MUSCLE SPASMS, Disp: , Rfl: 1 .  eletriptan (RELPAX) 40 MG tablet, Take 40 mg by mouth as needed for migraine or headache. May repeat in 2 hours if headache persists or recurs., Disp: , Rfl:  .  esomeprazole (NEXIUM) 20 MG capsule, Take 20 mg by mouth daily at 12 noon., Disp: , Rfl:  .  Lorcaserin HCl ER (BELVIQ XR) 20 MG TB24, Take 1 tablet by mouth daily., Disp: , Rfl:  .  Multiple Vitamin (MULTIVITAMIN) tablet, Take 1 tablet by mouth daily., Disp: , Rfl:  .  vitamin B-12 (CYANOCOBALAMIN) 1000 MCG tablet, Take 1,000 mcg by mouth daily., Disp: , Rfl:  .  benzonatate (TESSALON) 200 MG capsule, Take 1 capsule (200 mg total) by mouth 3 (three) times daily  as needed for cough., Disp: 30 capsule, Rfl: 0 .  doxycycline (VIBRAMYCIN) 100 MG capsule, Take 1 capsule (100 mg total) by mouth 2 (two) times daily for 7 days., Disp: 14 capsule, Rfl: 0 .  ibuprofen (ADVIL,MOTRIN) 600 MG tablet, Take 1 tablet (600 mg total) by mouth every 6 (six) hours as needed., Disp: 30 tablet, Rfl: 0 .  ipratropium (ATROVENT) 0.06 % nasal spray, Place 2 sprays into both nostrils 4 (four) times daily. 3-4 times/ day, Disp: 15 mL, Rfl: 0 .  Spacer/Aero-Holding Chambers (AEROCHAMBER PLUS) inhaler, Use as instructed, Disp: 1 each, Rfl: 2  Allergies  Allergen Reactions  . Avelox [Moxifloxacin] Anaphylaxis  . Penicillins Hives  . Avelox  [Moxifloxacin Hcl In Nacl]   . Septra [Sulfamethoxazole-Trimethoprim]   .  Sulfa Antibiotics Hives     ROS  As noted in HPI.   Physical Exam  BP 130/81 (BP Location: Left Arm)   Pulse 96   Temp 98.1 F (36.7 C) (Oral)   Resp 16   Ht 5\' 4"  (1.626 m)   Wt 92.1 kg   LMP 02/08/2017   SpO2 99%   BMI 34.84 kg/m   Constitutional: Well developed, well nourished, no acute distress Eyes:  EOMI, conjunctiva normal bilaterally HENT: Normocephalic, atraumatic,mucus membranes moist.  Bilateral external ears, external ear canals, TMs normal.  No effusion bilaterally.  No tenderness over the left TMJ.  Clear nasal congestion.  Erythematous, swollen turbinates.  Positive frontal sinus tenderness, exquisite maxillary sinus tenderness.  Normal oropharynx, uvula midline.  No obvious postnasal drip.  Positive cobblestoning. Neck: No anterior, posterior cervical lymphadenopathy. Respiratory: Normal inspiratory effort, lungs clear bilaterally, good air movement.  Positive lateral chest wall tenderness Cardiovascular: Normal rate, regular rhythm, no murmurs, rubs, gallops. GI: nondistended skin: No rash, skin intact Musculoskeletal: no deformities Neurologic: Alert & oriented x 3, no focal neuro deficits Psychiatric: Speech and behavior appropriate   ED Course   Medications - No data to display  Orders Placed This Encounter  Procedures  . Rapid Strep Screen ([facility] ONLY)    Standing Status:   Standing    Number of Occurrences:   1    Order Specific Question:   Patient immune status    Answer:   Normal  . Culture, group A strep    Standing Status:   Standing    Number of Occurrences:   1    Results for orders placed or performed during the hospital encounter of 09/10/18 (from the past 24 hour(s))  Rapid Strep Screen ([facility] ONLY)     Status: None   Collection Time: 09/10/18  9:08 AM  Result Value Ref Range   Streptococcus, Group A Screen (Direct) NEGATIVE NEGATIVE   No results found.  ED Clinical Impression  Upper respiratory tract  infection, unspecified type  Acute non-recurrent pansinusitis  Dysfunction of left eustachian tube   ED Assessment/Plan  Rapid strep negative.  Most likely a viral URI/sinusitis.  Suspect the otalgia is from eustachian tube dysfunction.  However, given the severity of her symptoms, will send home with a wait-and-see prescription of doxycycline.  We will have her discontinue the antihistamines, start Mucinex D, saline nasal irrigation with a Lloyd Huger med rinse and distilled water as often as she wants.  Advil 600 mg combined with 1 g of Tylenol together 3 or 4 times a day 2 puffs from her albuterol inhaler using a spacer every 4-6 hours, Atrovent nasal spray.  Discontinue the Rhinocort.  If  not getting better in a day or 2 with these medications, she will start the doxycycline.  Talked about starting steroids, however she does not severe asthma nor can I appreciate any wheezing on exam, so we have opted to defer these today.  She will follow-up with her primary care physician as needed after finishing the doxycycline if she is still not better.  Discussed medical decision making, treatment plan and plan for follow-up with patient.  She agrees with plan.  Meds ordered this encounter  Medications  . Spacer/Aero-Holding Chambers (AEROCHAMBER PLUS) inhaler    Sig: Use as instructed    Dispense:  1 each    Refill:  2  . ipratropium (ATROVENT) 0.06 % nasal spray    Sig: Place 2 sprays into both nostrils 4 (four) times daily. 3-4 times/ day    Dispense:  15 mL    Refill:  0  . benzonatate (TESSALON) 200 MG capsule    Sig: Take 1 capsule (200 mg total) by mouth 3 (three) times daily as needed for cough.    Dispense:  30 capsule    Refill:  0  . doxycycline (VIBRAMYCIN) 100 MG capsule    Sig: Take 1 capsule (100 mg total) by mouth 2 (two) times daily for 7 days.    Dispense:  14 capsule    Refill:  0  . ibuprofen (ADVIL,MOTRIN) 600 MG tablet    Sig: Take 1 tablet (600 mg total) by mouth every 6  (six) hours as needed.    Dispense:  30 tablet    Refill:  0    *This clinic note was created using Scientist, clinical (histocompatibility and immunogenetics)Dragon dictation software. Therefore, there may be occasional mistakes despite careful proofreading.   ?    Domenick GongMortenson, Coraline Talwar, MD 09/10/18 [phone]

## 2018-09-12 LAB — CULTURE, GROUP A STREP (THRC)

## 2019-10-18 ENCOUNTER — Other Ambulatory Visit: Payer: Self-pay

## 2019-10-18 ENCOUNTER — Ambulatory Visit
Admission: EM | Admit: 2019-10-18 | Discharge: 2019-10-18 | Disposition: A | Discharge status: Home / Self Care | Payer: BC Managed Care – PPO | Attending: Family Medicine | Admitting: Family Medicine

## 2019-10-18 ENCOUNTER — Encounter: Payer: Self-pay | Admitting: Emergency Medicine

## 2019-10-18 DIAGNOSIS — R0981 Nasal congestion: Secondary | ICD-10-CM

## 2019-10-18 DIAGNOSIS — Z20822 Contact with and (suspected) exposure to covid-19: Secondary | ICD-10-CM

## 2019-10-18 DIAGNOSIS — R519 Headache, unspecified: Secondary | ICD-10-CM

## 2019-10-18 DIAGNOSIS — J029 Acute pharyngitis, unspecified: Secondary | ICD-10-CM

## 2019-10-18 DIAGNOSIS — R05 Cough: Secondary | ICD-10-CM | POA: Diagnosis not present

## 2019-10-18 MED ORDER — PREDNISONE 50 MG PO TABS: ORAL_TABLET | ORAL | 0 refills | Status: DC

## 2019-10-18 NOTE — ED Triage Notes (Signed)
Patient c/o cough, sore throat, sinus pressure and congestion since Wed.  Patient denies fevers.

## 2019-10-18 NOTE — Discharge Instructions (Addendum)
Medication as prescribed.  Results available in 24 to 48 hours.  Stay home.  Take care  Dr. Schyler Counsell   

## 2019-10-18 NOTE — ED Provider Notes (Signed)
[facility]    CSN: [id number] Arrival date & time: 10/18/19  1407  History   Chief Complaint Chief Complaint  Patient presents with  . Cough  . Sinus Problem  . Sore Throat   HPI  54 year old female presents with multiple complaints.  Patient reports that she has been feeling sick since Wednesday.  She reports cough, sore throat, sinus pressure, congestion, rhinorrhea.  She reports severe headaches.  Denies fever.  No reported sick contacts.  She rates her pain as 8/10 in severity.  She is been taking over-the-counter Tylenol Cold and sinus without relief.  Patient believes that she has a sinus infection.  No known exacerbating factors.  No other associated symptoms.  No other complaints.  PMH, Surgical Hx, Family Hx, Social History reviewed and updated as below.  Past Medical History:  Diagnosis Date  . Anemia   . Asthma   . Diabetes mellitus without complication ([place])   . Endometriosis   . GERD (gastroesophageal reflux disease)   . Hypertension   . Migraine   . Sleep apnea    Past Surgical History:  Procedure Laterality Date  . APPENDECTOMY  1974  . CESAREAN SECTION  1991  . CHOLECYSTECTOMY  2005  . LAPAROSCOPIC GASTRIC BANDING  2015  . TUBAL LIGATION  1996   OB History   No obstetric history on file.    Home Medications    Prior to Admission medications   Medication Sig Start Date End Date Taking? Authorizing Provider  buPROPion (WELLBUTRIN XL) 300 MG 24 hr tablet Take 300 mg by mouth daily.   Yes [provider]  Cholecalciferol (VITAMIN D PO) Take 1 tablet by mouth daily.   Yes [provider]  eletriptan (RELPAX) 40 MG tablet Take 40 mg by mouth as needed for migraine or headache. May repeat in 2 hours if headache persists or recurs.   Yes [provider]  esomeprazole (NEXIUM) 20 MG capsule Take 20 mg by mouth daily at 12 noon.   Yes [provider]  Multiple Vitamin (MULTIVITAMIN) tablet Take 1 tablet by  mouth daily.   Yes [provider]  sucralfate (CARAFATE) 1 g tablet TAKE 1 TABLET(1 GRAM) BY MOUTH TWICE DAILY 09/14/19  Yes [provider]  vitamin B-12 (CYANOCOBALAMIN) 1000 MCG tablet Take 1,000 mcg by mouth daily.   Yes [provider]  albuterol (PROVENTIL HFA;VENTOLIN HFA) 108 (90 Base) MCG/ACT inhaler Inhale 2 puffs into the lungs every 4 (four) hours as needed. 09/17/16   Marylene Land, NP  cyclobenzaprine (FLEXERIL) 5 MG tablet TK 1-2 TS PO BID AS NEEDED FOR MUSCLE SPASMS 09/03/18   [provider]  ibuprofen (ADVIL,MOTRIN) 600 MG tablet Take 1 tablet (600 mg total) by mouth every 6 (six) hours as needed. 09/10/18   Melynda Ripple, MD  ipratropium (ATROVENT) 0.06 % nasal spray Place 2 sprays into both nostrils 4 (four) times daily. 3-4 times/ day 09/10/18   Melynda Ripple, MD  Lorcaserin HCl ER (BELVIQ XR) 20 MG TB24 Take 1 tablet by mouth daily.    [provider]  predniSONE (DELTASONE) 50 MG tablet 1 tablet daily x 5 days 10/18/19   Coral Spikes, DO  Spacer/Aero-Holding Chambers (AEROCHAMBER PLUS) inhaler Use as instructed 09/10/18   Melynda Ripple, MD    Family History Family History  Problem Relation Age of Onset  . Rheum arthritis Mother   . Diabetes Father   . Hypertension Father     Social History Social History  Tobacco Use  . Smoking status: Former Smoker    Quit date: 07/12/1999    Years since quitting: 20.2  . Smokeless tobacco: Never Used  Substance Use Topics  . Alcohol use: Yes    Comment: rare  . Drug use: No     Allergies   Avelox [moxifloxacin], Penicillins, Avelox  [moxifloxacin hcl in nacl], Septra [sulfamethoxazole-trimethoprim], and Sulfa antibiotics   Review of Systems Review of Systems  Constitutional: Negative for fever.  HENT: Positive for congestion, rhinorrhea, sinus pressure, sinus pain and sore throat.   Respiratory: Positive for cough.   Neurological: Positive for headaches.    Physical Exam Triage Vital Signs ED Triage Vitals  Enc Vitals Group     BP 10/18/19 1452 (!) 143/99     Pulse Rate 10/18/19 1452 93     Resp 10/18/19 1452 16     Temp 10/18/19 1452 98.2 F (36.8 C)     Temp Source 10/18/19 1452 Oral     SpO2 10/18/19 1452 98 %     Weight 10/18/19 1447 210 lb (95.3 kg)     Height 10/18/19 1447 5\' 3"  (1.6 m)     Head Circumference --      Peak Flow --      Pain Score 10/18/19 1447 8     Pain Loc --      Pain Edu? --      Excl. in GC? --    Updated Vital Signs BP (!) 143/99 (BP Location: Left Arm)   Pulse 93   Temp 98.2 F (36.8 C) (Oral)   Resp 16   Ht 5\' 3"  (1.6 m)   Wt 95.3 kg   LMP 02/08/2017   SpO2 98%   BMI 37.20 kg/m   Visual Acuity Right Eye Distance:   Left Eye Distance:   Bilateral Distance:    Right Eye Near:   Left Eye Near:    Bilateral Near:     Physical Exam Vitals and nursing note reviewed.  Constitutional:      General: She is not in acute distress.    Appearance: Normal appearance. She is not ill-appearing.  HENT:     Head: Normocephalic and atraumatic.     Nose: No rhinorrhea.     Mouth/Throat:     Pharynx: Posterior oropharyngeal erythema present.  Eyes:     General:        Right eye: No discharge.        Left eye: No discharge.     Conjunctiva/sclera: Conjunctivae normal.  Cardiovascular:     Rate and Rhythm: Normal rate and regular rhythm.     Heart sounds: No murmur.  Pulmonary:     Effort: Pulmonary effort is normal.     Breath sounds: Normal breath sounds. No wheezing, rhonchi or rales.  Neurological:     Mental Status: She is alert.  Psychiatric:        Mood and Affect: Mood normal.        Behavior: Behavior normal.    UC Treatments / Results  Labs (all labs ordered are listed, but only abnormal results are displayed) Labs Reviewed  NOVEL CORONAVIRUS, NAA (HOSP ORDER, SEND-OUT TO REF LAB; TAT 18-24 HRS)    EKG   Radiology No results found.  Procedures Procedures (including  critical care time)  Medications Ordered in UC Medications - No data to display  Initial Impression / Assessment and Plan / UC Course  I have reviewed the triage vital signs and the  nursing notes.  Pertinent labs & imaging results that were available during my care of the patient were reviewed by me and considered in my medical decision making (see chart for details).    54 year old female presents with respiratory symptoms.  Suspected COVID-19.  Has history of asthma.  Prednisone as directed.  Awaiting test results.  Work note given.  Final Clinical Impressions(s) / UC Diagnoses   Final diagnoses:  Suspected COVID-19 virus infection     Discharge Instructions     Medication as prescribed.  Results available in 24 to 48 hours.  Stay home.  Take care  Dr. Adriana Simas     ED Prescriptions    Medication Sig Dispense Auth. Provider   predniSONE (DELTASONE) 50 MG tablet 1 tablet daily x 5 days 5 tablet Everlene Other G, DO     PDMP not reviewed this encounter.   Tommie Sams, [place] 10/18/19 1536

## 2019-10-20 LAB — NOVEL CORONAVIRUS, NAA (HOSP ORDER, SEND-OUT TO REF LAB; TAT 18-24 HRS): SARS-CoV-2, NAA: NOT DETECTED

## 2020-01-10 ENCOUNTER — Ambulatory Visit: Payer: BC Managed Care – PPO | Attending: Internal Medicine

## 2020-01-10 DIAGNOSIS — Z23 Encounter for immunization: Secondary | ICD-10-CM

## 2020-01-10 NOTE — Progress Notes (Signed)
   Covid-19 Vaccination Clinic  Name:  Donna Collier    MRN: [id number] DOB: Oct 31, 1965  01/10/2020  Donna Collier was observed post Covid-19 immunization for 15 minutes without incident. She was provided with Vaccine Information Sheet and instruction to access the V-Safe system.   Donna Collier was instructed to call 911 with any severe reactions post vaccine: Marland Kitchen Difficulty breathing  . Swelling of face and throat  . A fast heartbeat  . A bad rash all over body  . Dizziness and weakness   Immunizations Administered    Name Date Dose VIS Date Route   Pfizer COVID-19 Vaccine 01/10/2020  2:13 PM 0.3 mL 09/25/2019 Intramuscular   Manufacturer: [employer], [employer]   Lot: [id number]   NDC: [address]-9

## 2020-02-02 ENCOUNTER — Ambulatory Visit: Payer: BC Managed Care – PPO | Attending: Internal Medicine

## 2020-02-02 DIAGNOSIS — Z23 Encounter for immunization: Secondary | ICD-10-CM

## 2020-02-02 NOTE — Progress Notes (Signed)
   Covid-19 Vaccination Clinic  Name:  ALFRED ECKLEY    MRN: [id number] DOB: 02-Feb-1966  02/02/2020  Ms. Gelber was observed post Covid-19 immunization for 30 minutes .  During the observation period, she experienced an adverse reaction with the following symptoms:  dizziness.  Assessment : Time of assessment 1620 Alert and oriented and Anxious.  Actions taken:  Vitals sign taken  VAERS form obtained and completed by RN. Jiuce given, VS at 1620- BP- 137 98, P- 11, O2- 99%, recheck of VS at 1634, BP- 145/94, P- 97, O2- 100%    Medications administered: No medication administered.  Disposition: Reports no further symptoms of adverse reaction after observation for 45 minutes. Discharged home. The Patient was provided with Vaccine Information Sheet and instruction to access the V-Safe system.    Immunizations Administered    Name Date Dose VIS Date Route   Pfizer COVID-19 Vaccine 02/02/2020  3:37 PM 0.3 mL 12/09/2018 Intramuscular   Manufacturer: [employer], [employer]   Lot: [id number]   NDC: [address]-6

## 2020-02-02 NOTE — Progress Notes (Signed)
   Covid-19 Vaccination Clinic  Name:  Donna Collier    MRN: [id number] DOB: [birth year]  02/02/2020  [name] was observed post Covid-19 immunization for 30 minutes without incident. She was provided with Vaccine Information Sheet and instruction to access the V-Safe system.   Ms. Para was instructed to call 911 with any severe reactions post vaccine: Marland Kitchen Difficulty breathing  . Swelling of face and throat  . A fast heartbeat  . A bad rash all over body  . Dizziness and weakness   Immunizations Administered    Name Date Dose VIS Date Route   Pfizer COVID-19 Vaccine 02/02/2020  3:37 PM 0.3 mL 12/09/2018 Intramuscular   Manufacturer: [employer], [employer]   Lot: [id number]   NDC: [address]-8

## 2020-02-04 ENCOUNTER — Telehealth: Payer: Self-pay | Admitting: *Deleted

## 2020-02-04 NOTE — Telephone Encounter (Signed)
Called and spoke with patient in regards to her second COVID vaccine that she received on April 19th. She stated that she felt fine and only had the normal symptoms that accompany the COVID vaccine. Advised patient that there is no allergic reaction detected and that she should be fine receiving future COVID vaccine booster. Patient verbalized understanding.

## 2021-04-09 ENCOUNTER — Encounter: Payer: Self-pay | Admitting: Emergency Medicine

## 2021-04-09 ENCOUNTER — Ambulatory Visit
Admission: EM | Admit: 2021-04-09 | Discharge: 2021-04-09 | Disposition: A | Discharge status: Home / Self Care | Payer: BC Managed Care – PPO | Attending: Internal Medicine | Admitting: Internal Medicine

## 2021-04-09 ENCOUNTER — Other Ambulatory Visit: Payer: Self-pay

## 2021-04-09 DIAGNOSIS — J45909 Unspecified asthma, uncomplicated: Secondary | ICD-10-CM | POA: Insufficient documentation

## 2021-04-09 DIAGNOSIS — Z881 Allergy status to other antibiotic agents status: Secondary | ICD-10-CM | POA: Diagnosis not present

## 2021-04-09 DIAGNOSIS — R0602 Shortness of breath: Secondary | ICD-10-CM | POA: Insufficient documentation

## 2021-04-09 DIAGNOSIS — J45901 Unspecified asthma with (acute) exacerbation: Secondary | ICD-10-CM | POA: Insufficient documentation

## 2021-04-09 DIAGNOSIS — R0982 Postnasal drip: Secondary | ICD-10-CM | POA: Diagnosis not present

## 2021-04-09 DIAGNOSIS — Z882 Allergy status to sulfonamides status: Secondary | ICD-10-CM | POA: Diagnosis not present

## 2021-04-09 DIAGNOSIS — Z880 Allergy status to penicillin: Secondary | ICD-10-CM | POA: Insufficient documentation

## 2021-04-09 DIAGNOSIS — J069 Acute upper respiratory infection, unspecified: Secondary | ICD-10-CM

## 2021-04-09 DIAGNOSIS — R0789 Other chest pain: Secondary | ICD-10-CM | POA: Diagnosis not present

## 2021-04-09 DIAGNOSIS — Z20822 Contact with and (suspected) exposure to covid-19: Secondary | ICD-10-CM | POA: Insufficient documentation

## 2021-04-09 DIAGNOSIS — J029 Acute pharyngitis, unspecified: Secondary | ICD-10-CM | POA: Insufficient documentation

## 2021-04-09 DIAGNOSIS — Z87891 Personal history of nicotine dependence: Secondary | ICD-10-CM | POA: Diagnosis not present

## 2021-04-09 DIAGNOSIS — H9209 Otalgia, unspecified ear: Secondary | ICD-10-CM | POA: Diagnosis not present

## 2021-04-09 DIAGNOSIS — R059 Cough, unspecified: Secondary | ICD-10-CM | POA: Insufficient documentation

## 2021-04-09 LAB — POCT RAPID STREP A: Streptococcus, Group A Screen (Direct): NEGATIVE

## 2021-04-09 MED ORDER — PREDNISONE 10 MG PO TABS: ORAL_TABLET | ORAL | 0 refills | Status: DC

## 2021-04-09 MED ORDER — LIDOCAINE VISCOUS HCL 2 % MT SOLN: 15.0000 mL | OROMUCOSAL | 0 refills | Status: DC | PRN

## 2021-04-09 NOTE — ED Triage Notes (Signed)
Pt c/o sore throat, nasal congestion, cough, shortness of breath. Started about 4 days ago. Denies fever.

## 2021-04-09 NOTE — Discharge Instructions (Addendum)
Symptoms may be related to allergies versus viral illness.  Continue with your antihistamine/decongestant and use of your corticosteroid nasal spray.  You should also start using nasal saline sinus rinses.  Increase fluid intake.  Continue with your inhaler if needed for any shortness of breath.  I have sent some prednisone for you as well since your chest is a little tight.   You have been swabbed for COVID.  See more information below.  We also performed a rapid strep test and it was negative but we will send it for culture and some will contact you if you need antibiotics.  If your symptoms worsen or are not improving over the next week then you should be seen again.  If you develop a fever or increased breathing difficulty, please do not wait and seek reexamination.  For any emergent symptoms please go to the ER.  You have received COVID testing today either for positive exposure, concerning symptoms that could be related to COVID infection, screening purposes, or re-testing after confirmed positive.  Your test obtained today checks for active viral infection in the last 1-2 weeks. If your test is negative now, you can still test positive later. So, if you do develop symptoms you should either get re-tested and/or isolate x 5 days and then strict mask use x 5 days (unvaccinated) or mask use x 10 days (vaccinated). Please follow CDC guidelines.  While Rapid antigen tests come back in 15-20 minutes, send out PCR/molecular test results typically come back within 1-3 days. In the mean time, if you are symptomatic, assume this could be a positive test and treat/monitor yourself as if you do have COVID.   We will call with test results if positive. Please download the MyChart app and set up a profile to access test results.   If symptomatic, go home and rest. Push fluids. Take Tylenol as needed for discomfort. Gargle warm salt water. Throat lozenges. Take Mucinex DM or Robitussin for cough. Humidifier in  bedroom to ease coughing. Warm showers. Also review the COVID handout for more information.  COVID-19 INFECTION: The incubation period of COVID-19 is approximately 14 days after exposure, with most symptoms developing in roughly 4-5 days. Symptoms may range in severity from mild to critically severe. Roughly 80% of those infected will have mild symptoms. People of any age may become infected with COVID-19 and have the ability to transmit the virus. The most common symptoms include: fever, fatigue, cough, body aches, headaches, sore throat, nasal congestion, shortness of breath, nausea, vomiting, diarrhea, changes in smell and/or taste.    COURSE OF ILLNESS Some patients may begin with mild disease which can progress quickly into critical symptoms. If your symptoms are worsening please call ahead to the Emergency Department and proceed there for further treatment. Recovery time appears to be roughly 1-2 weeks for mild symptoms and 3-6 weeks for severe disease.   GO IMMEDIATELY TO ER FOR FEVER YOU ARE UNABLE TO GET DOWN WITH TYLENOL, BREATHING PROBLEMS, CHEST PAIN, FATIGUE, LETHARGY, INABILITY TO EAT OR DRINK, ETC  QUARANTINE AND ISOLATION: To help decrease the spread of COVID-19 please remain isolated if you have COVID infection or are highly suspected to have COVID infection. This means -stay home and isolate to one room in the home if you live with others. Do not share a bed or bathroom with others while ill, sanitize and wipe down all countertops and keep common areas clean and disinfected. Stay home for 5 days. If you have no  symptoms or your symptoms are resolving after 5 days, you can leave your house. Continue to wear a mask around others for 5 additional days. If you have been in close contact (within 6 feet) of someone diagnosed with COVID 19, you are advised to quarantine in your home for 14 days as symptoms can develop anywhere from 2-14 days after exposure to the virus. If you develop symptoms,  you  must isolate.  Most current guidelines for COVID after exposure -unvaccinated: isolate 5 days and strict mask use x 5 days. Test on day 5 is possible -vaccinated: wear mask x 10 days if symptoms do not develop -You do not necessarily need to be tested for COVID if you have + exposure and  develop symptoms. Just isolate at home x10 days from symptom onset During this global pandemic, CDC advises to practice social distancing, try to stay at least 83ft away from others at all times. Wear a face covering. Wash and sanitize your hands regularly and avoid going anywhere that is not necessary.  KEEP IN MIND THAT THE COVID TEST IS NOT 100% ACCURATE AND YOU SHOULD STILL DO EVERYTHING TO PREVENT POTENTIAL SPREAD OF VIRUS TO OTHERS (WEAR MASK, WEAR GLOVES, WASH HANDS AND SANITIZE REGULARLY). IF INITIAL TEST IS NEGATIVE, THIS MAY NOT MEAN YOU ARE DEFINITELY NEGATIVE. MOST ACCURATE TESTING IS DONE 5-7 DAYS AFTER EXPOSURE.   It is not advised by CDC to get re-tested after receiving a positive COVID test since you can still test positive for weeks to months after you have already cleared the virus.   *If you have not been vaccinated for COVID, I strongly suggest you consider getting vaccinated as long as there are no contraindications.

## 2021-04-09 NOTE — ED Provider Notes (Signed)
[facility]    CSN: [id number] Arrival date & time: 04/09/21  1449      History   Chief Complaint Chief Complaint  Patient presents with   Sore Throat   Otalgia    HPI Donna Collier is a 55 y.o. female presenting for approximately 4-day history of nasal congestion and sinus pressure, bilateral ear pressure, postnasal drainage and sore throat.  Patient states that she has been taking over-the-counter antihistamine/decongestant medication and using Flonase and her sinus pressure and ear pressure have improved but her sore throat has not really improved.  She denies any fevers, fatigue, body aches.  She has had a little bit of a cough.  She also feels like her chest is tight and is feeling little short of breath.  She does have history of asthma and has used her albuterol inhaler which she says did seem to help.  No sick contacts and no known exposure to COVID-19.  Patient has been recently at the beach but says she has not really been around people.  She has no other complaints.  HPI  Past Medical History:  Diagnosis Date   Anemia    Asthma    Diabetes mellitus without complication (HCC)    Endometriosis    GERD (gastroesophageal reflux disease)    Hypertension    Migraine    Sleep apnea     There are no problems to display for this patient.   Past Surgical History:  Procedure Laterality Date   APPENDECTOMY  1974   CESAREAN SECTION  1991   CHOLECYSTECTOMY  2005   LAPAROSCOPIC GASTRIC BANDING  2015   TUBAL LIGATION  1996    OB History   No obstetric history on file.      Home Medications    Prior to Admission medications   Medication Sig Start Date End Date Taking? Authorizing Provider  albuterol (PROVENTIL HFA;VENTOLIN HFA) 108 (90 Base) MCG/ACT inhaler Inhale 2 puffs into the lungs every 4 (four) hours as needed. 09/17/16  Yes Renford Dills, NP  buPROPion (WELLBUTRIN XL) 300 MG 24 hr tablet Take 300 mg by mouth daily.   Yes [provider]   Cholecalciferol (VITAMIN D PO) Take 1 tablet by mouth daily.   Yes [provider]  cyclobenzaprine (FLEXERIL) 5 MG tablet TK 1-2 TS PO BID AS NEEDED FOR MUSCLE SPASMS 09/03/18  Yes [provider]  eletriptan (RELPAX) 40 MG tablet Take 40 mg by mouth as needed for migraine or headache. May repeat in 2 hours if headache persists or recurs.   Yes [provider]  esomeprazole (NEXIUM) 20 MG capsule Take 20 mg by mouth daily at 12 noon.   Yes [provider]  lidocaine (XYLOCAINE) 2 % solution Use as directed 15 mLs in the mouth or throat every 3 (three) hours as needed for mouth pain (swish and spit). 04/09/21  Yes Shirlee Latch, PA-C  metFORMIN (GLUCOPHAGE-XR) 500 MG 24 hr tablet Take 1 tablet by mouth daily. 02/20/21 02/20/22 Yes [provider]  Multiple Vitamin (MULTIVITAMIN) tablet Take 1 tablet by mouth daily.   Yes [provider]  predniSONE (DELTASONE) 10 MG tablet Take 6 tabs PO on day 1 and decrease by 1 tab daily until complete 04/09/21  Yes Eusebio Friendly B, PA-C  sucralfate (CARAFATE) 1 g tablet TAKE 1 TABLET(1 GRAM) BY MOUTH TWICE DAILY 09/14/19  Yes [provider]  ibuprofen (ADVIL,MOTRIN) 600 MG tablet Take 1 tablet (600 mg total) by mouth  every 6 (six) hours as needed. 09/10/18   Domenick GongMortenson, Ashley, MD  ipratropium (ATROVENT) 0.06 % nasal spray Place 2 sprays into both nostrils 4 (four) times daily. 3-4 times/ day 09/10/18   Domenick GongMortenson, Ashley, MD  Lorcaserin HCl ER 20 MG TB24 Take 1 tablet by mouth daily.    [provider]  Spacer/Aero-Holding Chambers (AEROCHAMBER PLUS) inhaler Use as instructed 09/10/18   Domenick GongMortenson, Ashley, MD  vitamin B-12 (CYANOCOBALAMIN) 1000 MCG tablet Take 1,000 mcg by mouth daily.    [provider]    Family History Family History  Problem Relation Age of Onset   Rheum arthritis Mother    Diabetes Father    Hypertension Father     Social History Social History   Tobacco  Use   Smoking status: Former    Pack years: 0.00    Types: Cigarettes    Quit date: 07/12/1999    Years since quitting: 21.7   Smokeless tobacco: Never  Vaping Use   Vaping Use: Never used  Substance Use Topics   Alcohol use: Yes    Comment: rare   Drug use: No     Allergies   Avelox [moxifloxacin], Penicillins, Avelox  [moxifloxacin hcl in nacl], Septra [sulfamethoxazole-trimethoprim], and Sulfa antibiotics   Review of Systems Review of Systems  Constitutional:  Negative for chills, diaphoresis, fatigue and fever.  HENT:  Positive for congestion, ear pain, rhinorrhea, sinus pressure and sore throat.   Respiratory:  Positive for cough and shortness of breath.   Cardiovascular:  Negative for chest pain.  Gastrointestinal:  Negative for abdominal pain, nausea and vomiting.  Musculoskeletal:  Negative for arthralgias and myalgias.  Skin:  Negative for rash.  Neurological:  Negative for weakness and headaches.  Hematological:  Negative for adenopathy.    Physical Exam Triage Vital Signs ED Triage Vitals [04/09/21 1500]  Enc Vitals Group     BP (!) 148/89     Pulse Rate (!) 104     Resp 18     Temp 99.1 F (37.3 C)     Temp Source Oral     SpO2 97 %     Weight 210 lb 1.6 oz (95.3 kg)     Height 5\' 3"  (1.6 m)     Head Circumference      Peak Flow      Pain Score 7     Pain Loc      Pain Edu?      Excl. in GC?    No data found.  Updated Vital Signs BP (!) 148/89 (BP Location: Right Arm)   Pulse (!) 104   Temp 99.1 F (37.3 C) (Oral)   Resp 18   Ht 5\' 3"  (1.6 m)   Wt 210 lb 1.6 oz (95.3 kg)   LMP 02/08/2017   SpO2 97%   BMI 37.22 kg/m       Physical Exam Vitals and nursing note reviewed.  Constitutional:      General: She is not in acute distress.    Appearance: Normal appearance. She is not ill-appearing or toxic-appearing.  HENT:     Head: Normocephalic and atraumatic.     Right Ear: Ear canal and external ear normal. A middle ear effusion is  present.     Left Ear: Ear canal and external ear normal. A middle ear effusion is present.     Nose: Congestion and rhinorrhea present.     Mouth/Throat:     Mouth: Mucous membranes are moist.  Pharynx: Oropharynx is clear. Posterior oropharyngeal erythema (with clear PND) present.  Eyes:     General: No scleral icterus.       Right eye: No discharge.        Left eye: No discharge.     Conjunctiva/sclera: Conjunctivae normal.  Cardiovascular:     Rate and Rhythm: Regular rhythm. Tachycardia present.     Heart sounds: Normal heart sounds.  Pulmonary:     Effort: Pulmonary effort is normal. No respiratory distress.     Breath sounds: Normal breath sounds.     Comments: Breath sounds somewhat decreased throughout Musculoskeletal:     Cervical back: Neck supple.  Skin:    General: Skin is dry.  Neurological:     General: No focal deficit present.     Mental Status: She is alert. Mental status is at baseline.     Motor: No weakness.     Gait: Gait normal.  Psychiatric:        Mood and Affect: Mood normal.        Behavior: Behavior normal.        Thought Content: Thought content normal.     UC Treatments / Results  Labs (all labs ordered are listed, but only abnormal results are displayed) Labs Reviewed  SARS CORONAVIRUS 2 (TAT 6-24 HRS)  CULTURE, GROUP A STREP [facility])  POCT RAPID STREP A, ED / UC  POCT RAPID STREP A    EKG   Radiology No results found.  Procedures Procedures (including critical care time)  Medications Ordered in UC Medications - No data to display  Initial Impression / Assessment and Plan / UC Course  I have reviewed the triage vital signs and the nursing notes.  Pertinent labs & imaging results that were available during my care of the patient were reviewed by me and considered in my medical decision making (see chart for details).  55 year old female with history of allergic rhinitis and asthma presenting for 4-day history of nasal  congestion, sinus pressure, bilateral ear pressure, sore throat and postnasal drainage as well as shortness of breath.  She is afebrile and overall well-appearing.  No acute respiratory distress.  Rapid strep test is negative.  We will send for culture.  COVID test also obtained.  Current CDC guidelines, isolation protocol and ED precautions.  The patient's clinical presentation consistent with allergic rhinitis and asthma exacerbation versus viral illness and asthma exacerbation.  Advised her to continue with the over-the-counter antihistamine/decongestant and nasal spray and to also add nasal saline and sinus rinses.  I have sent in prednisone to help with her mild asthma exacerbation advised her to continue with her inhalers.  Sent in viscous lidocaine to help with the sore throat.  Advised if the culture is positive we will call in an antibiotic for her.  Advised her to follow-up with [place] as needed for any worsening symptoms or she still not feeling better over the next week.   Final Clinical Impressions(s) / UC Diagnoses   Final diagnoses:  Upper respiratory tract infection, unspecified type  Sore throat  Post-nasal drainage  Asthma with acute exacerbation, unspecified asthma severity, unspecified whether persistent     Discharge Instructions      Symptoms may be related to allergies versus viral illness.  Continue with your antihistamine/decongestant and use of your corticosteroid nasal spray.  You should also start using nasal saline sinus rinses.  Increase fluid intake.  Continue with your inhaler if needed for any shortness of breath.  I have sent some prednisone for you as well since your chest is a little tight.   You have been swabbed for COVID.  See more information below.  We also performed a rapid strep test and it was negative but we will send it for culture and some will contact you if you need antibiotics.  If your symptoms worsen or are not improving over the next week then  you should be seen again.  If you develop a fever or increased breathing difficulty, please do not wait and seek reexamination.  For any emergent symptoms please go to the ER.  You have received COVID testing today either for positive exposure, concerning symptoms that could be related to COVID infection, screening purposes, or re-testing after confirmed positive.  Your test obtained today checks for active viral infection in the last 1-2 weeks. If your test is negative now, you can still test positive later. So, if you do develop symptoms you should either get re-tested and/or isolate x 5 days and then strict mask use x 5 days (unvaccinated) or mask use x 10 days (vaccinated). Please follow CDC guidelines.  While Rapid antigen tests come back in 15-20 minutes, send out PCR/molecular test results typically come back within 1-3 days. In the mean time, if you are symptomatic, assume this could be a positive test and treat/monitor yourself as if you do have COVID.   We will call with test results if positive. Please download the MyChart app and set up a profile to access test results.   If symptomatic, go home and rest. Push fluids. Take Tylenol as needed for discomfort. Gargle warm salt water. Throat lozenges. Take Mucinex DM or Robitussin for cough. Humidifier in bedroom to ease coughing. Warm showers. Also review the COVID handout for more information.  COVID-19 INFECTION: The incubation period of COVID-19 is approximately 14 days after exposure, with most symptoms developing in roughly 4-5 days. Symptoms may range in severity from mild to critically severe. Roughly 80% of those infected will have mild symptoms. People of any age may become infected with COVID-19 and have the ability to transmit the virus. The most common symptoms include: fever, fatigue, cough, body aches, headaches, sore throat, nasal congestion, shortness of breath, nausea, vomiting, diarrhea, changes in smell and/or taste.    COURSE  OF ILLNESS Some patients may begin with mild disease which can progress quickly into critical symptoms. If your symptoms are worsening please call ahead to the Emergency Department and proceed there for further treatment. Recovery time appears to be roughly 1-2 weeks for mild symptoms and 3-6 weeks for severe disease.   GO IMMEDIATELY TO ER FOR FEVER YOU ARE UNABLE TO GET DOWN WITH TYLENOL, BREATHING PROBLEMS, CHEST PAIN, FATIGUE, LETHARGY, INABILITY TO EAT OR DRINK, ETC  QUARANTINE AND ISOLATION: To help decrease the spread of COVID-19 please remain isolated if you have COVID infection or are highly suspected to have COVID infection. This means -stay home and isolate to one room in the home if you live with others. Do not share a bed or bathroom with others while ill, sanitize and wipe down all countertops and keep common areas clean and disinfected. Stay home for 5 days. If you have no symptoms or your symptoms are resolving after 5 days, you can leave your house. Continue to wear a mask around others for 5 additional days. If you have been in close contact (within 6 feet) of someone diagnosed with COVID 19, you are advised to quarantine in  your home for 14 days as symptoms can develop anywhere from 2-14 days after exposure to the virus. If you develop symptoms, you  must isolate.  Most current guidelines for COVID after exposure -unvaccinated: isolate 5 days and strict mask use x 5 days. Test on day 5 is possible -vaccinated: wear mask x 10 days if symptoms do not develop -You do not necessarily need to be tested for COVID if you have + exposure and  develop symptoms. Just isolate at home x10 days from symptom onset During this global pandemic, CDC advises to practice social distancing, try to stay at least 83ft away from others at all times. Wear a face covering. Wash and sanitize your hands regularly and avoid going anywhere that is not necessary.  KEEP IN MIND THAT THE COVID TEST IS NOT 100%  ACCURATE AND YOU SHOULD STILL DO EVERYTHING TO PREVENT POTENTIAL SPREAD OF VIRUS TO OTHERS (WEAR MASK, WEAR GLOVES, WASH HANDS AND SANITIZE REGULARLY). IF INITIAL TEST IS NEGATIVE, THIS MAY NOT MEAN YOU ARE DEFINITELY NEGATIVE. MOST ACCURATE TESTING IS DONE 5-7 DAYS AFTER EXPOSURE.   It is not advised by CDC to get re-tested after receiving a positive COVID test since you can still test positive for weeks to months after you have already cleared the virus.   *If you have not been vaccinated for COVID, I strongly suggest you consider getting vaccinated as long as there are no contraindications.       ED Prescriptions     Medication Sig Dispense Auth. Provider   lidocaine (XYLOCAINE) 2 % solution Use as directed 15 mLs in the mouth or throat every 3 (three) hours as needed for mouth pain (swish and spit). 100 mL Eusebio Friendly B, PA-C   predniSONE (DELTASONE) 10 MG tablet Take 6 tabs PO on day 1 and decrease by 1 tab daily until complete 21 tablet Shirlee Latch, PA-C      PDMP not reviewed this encounter.   Shirlee Latch, PA-C 04/09/21 1531

## 2021-04-10 LAB — SARS CORONAVIRUS 2 (TAT 6-24 HRS): SARS Coronavirus 2: NEGATIVE

## 2021-04-12 LAB — CULTURE, GROUP A STREP (THRC)

## 2022-01-06 ENCOUNTER — Other Ambulatory Visit: Payer: Self-pay

## 2022-01-06 ENCOUNTER — Encounter: Payer: Self-pay | Admitting: Emergency Medicine

## 2022-01-06 ENCOUNTER — Ambulatory Visit
Admission: EM | Admit: 2022-01-06 | Discharge: 2022-01-06 | Disposition: A | Discharge status: Home / Self Care | Payer: BC Managed Care – PPO | Attending: Emergency Medicine | Admitting: Emergency Medicine

## 2022-01-06 DIAGNOSIS — J329 Chronic sinusitis, unspecified: Secondary | ICD-10-CM | POA: Insufficient documentation

## 2022-01-06 DIAGNOSIS — R448 Other symptoms and signs involving general sensations and perceptions: Secondary | ICD-10-CM

## 2022-01-06 DIAGNOSIS — Z792 Long term (current) use of antibiotics: Secondary | ICD-10-CM | POA: Diagnosis not present

## 2022-01-06 DIAGNOSIS — H9209 Otalgia, unspecified ear: Secondary | ICD-10-CM | POA: Diagnosis not present

## 2022-01-06 DIAGNOSIS — R0981 Nasal congestion: Secondary | ICD-10-CM | POA: Diagnosis not present

## 2022-01-06 DIAGNOSIS — Z20822 Contact with and (suspected) exposure to covid-19: Secondary | ICD-10-CM | POA: Diagnosis not present

## 2022-01-06 LAB — RESP PANEL BY RT-PCR (FLU A&B, COVID) ARPGX2
Influenza A by PCR: NEGATIVE
Influenza B by PCR: NEGATIVE
SARS Coronavirus 2 by RT PCR: NEGATIVE

## 2022-01-06 LAB — GROUP A STREP BY PCR: Group A Strep by PCR: NOT DETECTED

## 2022-01-06 MED ORDER — AMOXICILLIN-POT CLAVULANATE 875-125 MG PO TABS: 1.0000 | ORAL_TABLET | Freq: Two times a day (BID) | ORAL | 0 refills | Status: AC

## 2022-01-06 NOTE — Discharge Instructions (Addendum)
Take Augmentin twice daily for ten days.  

## 2022-01-06 NOTE — ED Provider Notes (Signed)
? ?[facility] ?Provider Note ? ?Patient Contact: 8:40 AM (approximate) ? ? ?History  ? ?Sinus Problem and Otalgia ? ? ?HPI ? ?Donna Collier is a 56 y.o. female presents to the emergency department with facial pressure, rhinorrhea and postnasal drip for the past 7 to 10 days.  Patient reports that she was around her grandkids who are sick but was negative for strep.  She states that she does frequently get sinus infections this time of year.  She states that she used to take penicillin as a child states that 1 time she had hives and stopped taking it.  No prior history of anaphylaxis with penicillins.  She denies fever or chills at home.  No persistent cough. ? ?  ? ? ?Physical Exam  ? ?Triage Vital Signs: ?ED Triage Vitals  ?Enc Vitals Group  ?   BP 01/06/22 0826 (!) 137/92  ?   Pulse Rate 01/06/22 0826 94  ?   Resp 01/06/22 0826 15  ?   Temp 01/06/22 0826 98.2 ?F (36.8 ?C)  ?   Temp Source 01/06/22 0826 Oral  ?   SpO2 01/06/22 0826 98 %  ?   Weight 01/06/22 0821 210 lb 1.6 oz (95.3 kg)  ?   Height 01/06/22 0821 5\' 3"  (1.6 m)  ?   Head Circumference --   ?   Peak Flow --   ?   Pain Score 01/06/22 0820 8  ?   Pain Loc --   ?   Pain Edu? --   ?   Excl. in GC? --   ? ? ?Most recent vital signs: ?Vitals:  ? 01/06/22 0826  ?BP: (!) 137/92  ?Pulse: 94  ?Resp: 15  ?Temp: 98.2 ?F (36.8 ?C)  ?SpO2: 98%  ? ? ? ?General: Alert and in no acute distress. ?Eyes:  PERRL. EOMI. ?Head: No acute traumatic findings ?ENT: ?     Ears: Tms are effused bilaterally.  ?     Nose: No congestion/rhinnorhea. ?     Mouth/Throat: Mucous membranes are moist.  ?Neck: No stridor. No cervical spine tenderness to palpation. ?Cardiovascular:  Good peripheral perfusion ?Respiratory: Normal respiratory effort without tachypnea or retractions. Lungs CTAB. Good air entry to the bases with no decreased or absent breath sounds. ?Gastrointestinal: Bowel sounds ?4 quadrants. Soft and nontender to palpation. No guarding or rigidity. No  palpable masses. No distention. No CVA tenderness. ?Musculoskeletal: Full range of motion to all extremities.  ?Neurologic:  No gross focal neurologic deficits are appreciated.  ?Skin:   No rash noted ?Other: ? ? ?ED Results / Procedures / Treatments  ? ?Labs ?(all labs ordered are listed, but only abnormal results are displayed) ?Labs Reviewed  ?GROUP A STREP BY PCR  ?RESP PANEL BY RT-PCR (FLU A&B, COVID) [id number]  ?COVID-19, FLU A+B NAA  ? ? ? ?PROCEDURES: ? ?Critical Care performed: No ? ?Procedures ? ? ?MEDICATIONS ORDERED IN ED: ?Medications - No data to display ? ? ?IMPRESSION / MDM / ASSESSMENT AND PLAN / ED COURSE  ?I reviewed the triage vital signs and the nursing notes. ?             ?               ? ?Assessment and plan: ?Sinusitis:  ? ?Differential diagnosis includes, but is not limited to, sinusitis, COVID-19, unspecified viral infection ? ?56 year old female presents to the urgent care with facial pressure, postnasal drip and nasal congestion with history  of sinusitis. ? ?Vital signs were reassuring at triage.  On physical exam, patient was alert, active and nontoxic-appearing.  We will treat with Augmentin twice daily for the next 10 days.  Return precautions were given to return with new or worsening symptoms. ? ?  ? ? ?FINAL CLINICAL IMPRESSION(S) / ED DIAGNOSES  ? ?Final diagnoses:  ?Facial pressure  ? ? ? ?Rx / DC Orders  ? ?ED Discharge Orders   ? ?      Ordered  ?  amoxicillin-clavulanate (AUGMENTIN) 875-125 MG tablet  2 times daily       ? 01/06/22 0838  ? ?  ?  ? ?  ? ? ? ?Note:  This document was prepared using Dragon voice recognition software and may include unintentional dictation errors. ?  ?Orvil Feil, PA-C ?01/06/22 6294 ? ?

## 2022-01-06 NOTE — ED Triage Notes (Signed)
Patient c/o sinus congestion and pain, watery eyes, sneezing, HAs, post nasal drip, and bilateral ear pain that started on Thursday.  Patient denies fevers.   ?

## 2022-10-20 ENCOUNTER — Ambulatory Visit
Admission: EM | Admit: 2022-10-20 | Discharge: 2022-10-20 | Disposition: A | Discharge status: Home / Self Care | Payer: BC Managed Care – PPO

## 2022-10-20 ENCOUNTER — Encounter: Payer: Self-pay | Admitting: Emergency Medicine

## 2022-10-20 DIAGNOSIS — R051 Acute cough: Secondary | ICD-10-CM | POA: Diagnosis not present

## 2022-10-20 DIAGNOSIS — J0100 Acute maxillary sinusitis, unspecified: Secondary | ICD-10-CM

## 2022-10-20 MED ORDER — PREDNISONE 20 MG PO TABS: 40.0000 mg | ORAL_TABLET | Freq: Every day | ORAL | 0 refills | Status: AC

## 2022-10-20 MED ORDER — DOXYCYCLINE HYCLATE 100 MG PO CAPS: 100.0000 mg | ORAL_CAPSULE | Freq: Two times a day (BID) | ORAL | 0 refills | Status: AC

## 2022-10-20 NOTE — ED Provider Notes (Signed)
[facility]    CSN: [id number] Arrival date & time: 10/20/22  1104      History   Chief Complaint Chief Complaint  Patient presents with   Sinus Problem   Nasal Congestion    HPI Donna Collier is a 57 y.o. female presenting for 3-week history of sinus pressure and pain, headaches, cough, congestion, chills and fatigue.  Patient reports his symptoms are not getting any better and seem to be getting worse.  She reports that when she tries to wear her CPAP at night the pressure of that on her face causes pain and she feels pain in her teeth.  She reports history of sinus infections and says this feels like a sinus faction.  She has been taking Allegra-D and also switched to Tylenol Cold and sinus and use Flonase and nasal saline all without relief.  No other complaints.  HPI  Past Medical History:  Diagnosis Date   Anemia    Asthma    Diabetes mellitus without complication ([place])    Endometriosis    GERD (gastroesophageal reflux disease)    Hypertension    Migraine    Sleep apnea     There are no problems to display for this patient.   Past Surgical History:  Procedure Laterality Date   APPENDECTOMY  1974   [place]   CHOLECYSTECTOMY  2005   LAPAROSCOPIC GASTRIC BANDING  2015   TUBAL LIGATION  1996    OB History   No obstetric history on file.      Home Medications    Prior to Admission medications   Medication Sig Start Date End Date Taking? Authorizing Provider  doxycycline (VIBRAMYCIN) 100 MG capsule Take 1 capsule (100 mg total) by mouth 2 (two) times daily for 7 days. 10/20/22 10/27/22 Yes Danton Clap, PA-C  predniSONE (DELTASONE) 20 MG tablet Take 2 tablets (40 mg total) by mouth daily for 5 days. 10/20/22 10/25/22 Yes Laurene Footman B, PA-C  RYBELSUS 7 MG TABS Take 1 tablet by mouth daily. 09/12/22  Yes [provider]  albuterol (PROVENTIL HFA;VENTOLIN HFA) 108 (90 Base) MCG/ACT inhaler Inhale 2 puffs into the lungs every 4  (four) hours as needed. 09/17/16   Marylene Land, NP  buPROPion (WELLBUTRIN XL) 300 MG 24 hr tablet Take 300 mg by mouth daily.    [provider]  Cholecalciferol (VITAMIN D PO) Take 1 tablet by mouth daily.    [provider]  cyclobenzaprine (FLEXERIL) 5 MG tablet TK 1-2 TS PO BID AS NEEDED FOR MUSCLE SPASMS 09/03/18   [provider]  eletriptan (RELPAX) 40 MG tablet Take 40 mg by mouth as needed for migraine or headache. May repeat in 2 hours if headache persists or recurs.    [provider]  esomeprazole (NEXIUM) 20 MG capsule Take 20 mg by mouth daily at 12 noon.    [provider]  ibuprofen (ADVIL,MOTRIN) 600 MG tablet Take 1 tablet (600 mg total) by mouth every 6 (six) hours as needed. 09/10/18   Melynda Ripple, MD  ipratropium (ATROVENT) 0.06 % nasal spray Place 2 sprays into both nostrils 4 (four) times daily. 3-4 times/ day 09/10/18   Melynda Ripple, MD  lidocaine (XYLOCAINE) 2 % solution Use as directed 15 mLs in the mouth or throat every 3 (three) hours as needed for mouth pain (swish and spit). 04/09/21   Danton Clap, PA-C  Lorcaserin HCl ER 20 MG TB24 Take 1 tablet by  mouth daily.    [provider]  metFORMIN (GLUCOPHAGE-XR) 500 MG 24 hr tablet Take 1 tablet by mouth daily. 02/20/21 02/20/22  [provider]  Multiple Vitamin (MULTIVITAMIN) tablet Take 1 tablet by mouth daily.    [provider]  Spacer/Aero-Holding Chambers (AEROCHAMBER PLUS) inhaler Use as instructed 09/10/18   Melynda Ripple, MD  sucralfate (CARAFATE) 1 g tablet TAKE 1 TABLET(1 GRAM) BY MOUTH TWICE DAILY 09/14/19   [provider]  vitamin B-12 (CYANOCOBALAMIN) 1000 MCG tablet Take 1,000 mcg by mouth daily.    [provider]    Family History Family History  Problem Relation Age of Onset   Rheum arthritis Mother    Diabetes Father    Hypertension Father     Social History Social History   Tobacco Use    Smoking status: Former    Types: Cigarettes    Quit date: 07/12/1999    Years since quitting: 23.2   Smokeless tobacco: Never  Vaping Use   Vaping Use: Never used  Substance Use Topics   Alcohol use: Yes    Comment: rare   Drug use: No     Allergies   Avelox [moxifloxacin], Penicillins, Avelox  [moxifloxacin hcl in nacl], Septra [sulfamethoxazole-trimethoprim], and Sulfa antibiotics   Review of Systems Review of Systems  Constitutional:  Positive for chills and fatigue. Negative for diaphoresis and fever.  HENT:  Positive for congestion, rhinorrhea, sinus pressure and sinus pain. Negative for ear pain and sore throat.   Respiratory:  Positive for cough. Negative for shortness of breath.   Gastrointestinal:  Negative for abdominal pain, nausea and vomiting.  Musculoskeletal:  Negative for arthralgias and myalgias.  Skin:  Negative for rash.  Neurological:  Negative for weakness and headaches.  Hematological:  Negative for adenopathy.     Physical Exam Triage Vital Signs ED Triage Vitals  Enc Vitals Group     BP 10/20/22 1112 (!) 154/78     Pulse Rate 10/20/22 1112 98     Resp 10/20/22 1112 14     Temp 10/20/22 1112 98.2 F (36.8 C)     Temp Source 10/20/22 1112 Oral     SpO2 10/20/22 1112 98 %     Weight 10/20/22 1111 210 lb 1.6 oz (95.3 kg)     Height 10/20/22 1111 5\' 3"  (1.6 m)     Head Circumference --      Peak Flow --      Pain Score 10/20/22 1110 5     Pain Loc --      Pain Edu? --      Excl. in [place]? --    No data found.  Updated Vital Signs BP (!) 154/78 (BP Location: Right Arm)   Pulse 98   Temp 98.2 F (36.8 C) (Oral)   Resp 14   Ht 5\' 3"  (1.6 m)   Wt 210 lb 1.6 oz (95.3 kg)   LMP 02/08/2017   SpO2 98%   BMI 37.22 kg/m     Physical Exam Vitals and nursing note reviewed.  Constitutional:      General: She is not in acute distress.    Appearance: Normal appearance. She is not ill-appearing or toxic-appearing.  HENT:     Head:  Normocephalic and atraumatic.     Right Ear: Tympanic membrane, ear canal and external ear normal.     Left Ear: Tympanic membrane, ear canal and external ear normal.     Nose: Congestion present.  Right Sinus: Maxillary sinus tenderness present.     Left Sinus: Maxillary sinus tenderness present.     Mouth/Throat:     Mouth: Mucous membranes are moist.     Pharynx: Oropharynx is clear.  Eyes:     General: No scleral icterus.       Right eye: No discharge.        Left eye: No discharge.     Conjunctiva/sclera: Conjunctivae normal.  Cardiovascular:     Rate and Rhythm: Normal rate and regular rhythm.     Heart sounds: Normal heart sounds.  Pulmonary:     Effort: Pulmonary effort is normal. No respiratory distress.     Breath sounds: Normal breath sounds.  Musculoskeletal:     Cervical back: Neck supple.  Skin:    General: Skin is dry.  Neurological:     General: No focal deficit present.     Mental Status: She is alert. Mental status is at baseline.     Motor: No weakness.     Gait: Gait normal.  Psychiatric:        Mood and Affect: Mood normal.        Behavior: Behavior normal.        Thought Content: Thought content normal.      UC Treatments / Results  Labs (all labs ordered are listed, but only abnormal results are displayed) Labs Reviewed - No data to display  EKG   Radiology No results found.  Procedures Procedures (including critical care time)  Medications Ordered in UC Medications - No data to display  Initial Impression / Assessment and Plan / UC Course  I have reviewed the triage vital signs and the nursing notes.  Pertinent labs & imaging results that were available during my care of the patient were reviewed by me and considered in my medical decision making (see chart for details).   57 year old female presents for 3-week history of sinus pressure and congestion, sinus pain and cough as well as chills and fatigue.  Symptoms not improving  despite multiple over-the-counter medications.  History of sinusitis.  She is overall well-appearing today.  She does have nasal congestion and tenderness of bilateral maxillary sinuses.  Chest clear to auscultation.  Suspect bacterial sinusitis versus allergic sinusitis/rhinitis.  Will treat with doxycycline and prednisone.  Advised to continue decongestants, nasal sprays.  Follow-up needed.   Final Clinical Impressions(s) / UC Diagnoses   Final diagnoses:  Acute maxillary sinusitis, recurrence not specified  Acute cough     Discharge Instructions      -I have sent antibiotics and prednisone for sinus infection.  Continue with your Allegra-D, Flonase and saline as well.     ED Prescriptions     Medication Sig Dispense Auth. Provider   doxycycline (VIBRAMYCIN) 100 MG capsule Take 1 capsule (100 mg total) by mouth 2 (two) times daily for 7 days. 14 capsule Eusebio Friendly B, PA-C   predniSONE (DELTASONE) 20 MG tablet Take 2 tablets (40 mg total) by mouth daily for 5 days. 10 tablet Gareth Morgan      PDMP not reviewed this encounter.   Shirlee Latch, PA-C 10/20/22 1159

## 2022-10-20 NOTE — ED Triage Notes (Signed)
Patient c/o headache, sinus pressure and congestion, and chills for the past 3 weeks.  Patient denies fevers.

## 2022-10-20 NOTE — Discharge Instructions (Addendum)
-  I have sent antibiotics and prednisone for sinus infection.  Continue with your Allegra-D, Flonase and saline as well.

## 2022-10-21 ENCOUNTER — Ambulatory Visit: Payer: Self-pay

## 2024-03-18 ENCOUNTER — Ambulatory Visit
Admission: EM | Admit: 2024-03-18 | Discharge: 2024-03-18 | Disposition: A | Discharge status: Home / Self Care | Attending: Emergency Medicine | Admitting: Emergency Medicine

## 2024-03-18 ENCOUNTER — Ambulatory Visit (INDEPENDENT_AMBULATORY_CARE_PROVIDER_SITE_OTHER)

## 2024-03-18 DIAGNOSIS — R109 Unspecified abdominal pain: Secondary | ICD-10-CM | POA: Insufficient documentation

## 2024-03-18 DIAGNOSIS — M546 Pain in thoracic spine: Secondary | ICD-10-CM | POA: Diagnosis present

## 2024-03-18 LAB — URINALYSIS, W/ REFLEX TO CULTURE (INFECTION SUSPECTED)
Bilirubin Urine: NEGATIVE
Glucose, UA: NEGATIVE mg/dL
Hgb urine dipstick: NEGATIVE
Ketones, ur: NEGATIVE mg/dL
Leukocytes,Ua: NEGATIVE
Nitrite: NEGATIVE
Protein, ur: NEGATIVE mg/dL
RBC / HPF: NONE SEEN RBC/hpf (ref 0–5)
Specific Gravity, Urine: >1.03 — ABNORMAL HIGH (ref 1.005–1.030)
pH: 5 (ref 5.0–8.0)

## 2024-03-18 MED ORDER — BACLOFEN 10 MG PO TABS: 10.0000 mg | ORAL_TABLET | Freq: Three times a day (TID) | ORAL | 0 refills | Status: AC

## 2024-03-18 MED ORDER — IBUPROFEN 600 MG PO TABS: 600.0000 mg | ORAL_TABLET | Freq: Four times a day (QID) | ORAL | 0 refills | Status: AC | PRN

## 2024-03-18 NOTE — ED Provider Notes (Addendum)
 [facility]    CSN: [id number] Arrival date & time: 03/18/24  1814      History   Chief Complaint Chief Complaint  Patient presents with   Back Pain    HPI Donna Collier is a 58 y.o. female.   HPI  58 year old female with past medical history significant for anemia, asthma, diabetes, endometriosis, GERD, hypertension, migraine headaches, and sleep apnea presents for evaluation of 5 days worth of right flank pain.  She reports that for the first 3 days it was intermittent but for the last 2 days it has been constant.  She describes it as a constant presents but she cannot really describe it as being sharp, or dull, or stabbing.  She is rating it as an 8-9/10 at present.  The pain does increase with movement.  There is also associated urinary urgency.  The patient has had kidney stones in the past and reports that what she is feeling now is similar to when she had kidney stones previously.  No associated fever, pain with urination, urinary frequency, blood in urine, or vomiting.  She did have nausea several days ago but has not had any in the past 2 or 3 days.  Past Medical History:  Diagnosis Date   Anemia    Asthma    Diabetes mellitus without complication (HCC)    Endometriosis    GERD (gastroesophageal reflux disease)    Hypertension    Migraine    Sleep apnea     There are no active problems to display for this patient.   Past Surgical History:  Procedure Laterality Date   APPENDECTOMY  1974   CESAREAN SECTION  1991   CHOLECYSTECTOMY  2005   LAPAROSCOPIC GASTRIC BANDING  2015   TUBAL LIGATION  1996    OB History   No obstetric history on file.      Home Medications    Prior to Admission medications   Medication Sig Start Date End Date Taking? Authorizing Provider  baclofen (LIORESAL) 10 MG tablet Take 1 tablet (10 mg total) by mouth 3 (three) times daily. 03/18/24  Yes Kent Pear, NP  buPROPion (WELLBUTRIN XL) 300 MG 24 hr tablet Take 300 mg by  mouth daily.   Yes [provider]  Cholecalciferol (VITAMIN D PO) Take 1 tablet by mouth daily.   Yes [provider]  eletriptan (RELPAX) 40 MG tablet Take 40 mg by mouth as needed for migraine or headache. May repeat in 2 hours if headache persists or recurs.   Yes [provider]  esomeprazole (NEXIUM) 20 MG capsule Take 20 mg by mouth daily at 12 noon.   Yes [provider]  ibuprofen  (ADVIL ) 600 MG tablet Take 1 tablet (600 mg total) by mouth every 6 (six) hours as needed. 03/18/24  Yes Kent Pear, NP  Lorcaserin HCl ER 20 MG TB24 Take 1 tablet by mouth daily.   Yes [provider]  metFORMIN (GLUCOPHAGE-XR) 500 MG 24 hr tablet Take 1 tablet by mouth daily. 02/20/21 03/18/24 Yes [provider]  Multiple Vitamin (MULTIVITAMIN) tablet Take 1 tablet by mouth daily.   Yes [provider]  RYBELSUS 7 MG TABS Take 1 tablet by mouth daily. 09/12/22  Yes [provider]  sucralfate (CARAFATE) 1 g tablet TAKE 1 TABLET(1 GRAM) BY MOUTH TWICE DAILY 09/14/19  Yes [provider]  vitamin B-12 (CYANOCOBALAMIN) 1000 MCG tablet Take 1,000 mcg by mouth daily.   Yes [provider]  Family History Family History  Problem Relation Age of Onset   Rheum arthritis Mother    Diabetes Father    Hypertension Father     Social History Social History   Tobacco Use   Smoking status: Former    Current packs/day: 0.00    Types: Cigarettes    Quit date: 07/12/1999    Years since quitting: 24.7   Smokeless tobacco: Never  Vaping Use   Vaping status: Never Used  Substance Use Topics   Alcohol use: Yes    Comment: rare   Drug use: No     Allergies   Moxifloxacin, Penicillins, Avelox  [moxifloxacin hcl in nacl], Sulfamethoxazole-trimethoprim, Topiramate, and Sulfa antibiotics   Review of Systems Review of Systems  Gastrointestinal:  Positive for abdominal pain and nausea. Negative for vomiting.  Genitourinary:   Positive for flank pain and urgency. Negative for dysuria, frequency and hematuria.  Musculoskeletal:  Positive for back pain.     Physical Exam Triage Vital Signs ED Triage Vitals  Encounter Vitals Group     BP --      Systolic BP Percentile --      Diastolic BP Percentile --      Pulse --      Resp 03/18/24 1818 16     Temp --      Temp Source 03/18/24 1818 Oral     SpO2 --      Weight 03/18/24 1817 220 lb (99.8 kg)     Height 03/18/24 1817 5\' 4"  (1.626 m)     Head Circumference --      Peak Flow --      Pain Score 03/18/24 1822 8     Pain Loc --      Pain Education --      Exclude from Growth Chart --    No data found.  Updated Vital Signs BP (!) 142/84 (BP Location: Right Arm)   Pulse 90   Temp 98.4 F (36.9 C) (Oral)   Resp 16   Ht 5\' 4"  (1.626 m)   Wt 220 lb (99.8 kg)   LMP 02/08/2017   SpO2 96%   BMI 37.76 kg/m   Visual Acuity Right Eye Distance:   Left Eye Distance:   Bilateral Distance:    Right Eye Near:   Left Eye Near:    Bilateral Near:     Physical Exam Vitals and nursing note reviewed.  Constitutional:      Appearance: Normal appearance. She is not ill-appearing.  HENT:     Head: Normocephalic and atraumatic.  Abdominal:     Palpations: Abdomen is soft.     Tenderness: There is abdominal tenderness. There is no right CVA tenderness, left CVA tenderness, guarding or rebound.  Skin:    General: Skin is warm and dry.     Capillary Refill: Capillary refill takes less than 2 seconds.     Findings: No rash.  Neurological:     General: No focal deficit present.     Mental Status: She is alert and oriented to person, place, and time.      UC Treatments / Results  Labs (all labs ordered are listed, but only abnormal results are displayed) Labs Reviewed  URINALYSIS, W/ REFLEX TO CULTURE (INFECTION SUSPECTED) - Abnormal; Notable for the following components:      Result Value   Specific Gravity, Urine >1.030 (*)    Bacteria, UA FEW (*)     All other components within normal limits  EKG   Radiology No results found.  Procedures Procedures (including critical care time)  Medications Ordered in UC Medications - No data to display  Initial Impression / Assessment and Plan / UC Course  I have reviewed the triage vital signs and the nursing notes.  Pertinent labs & imaging results that were available during my care of the patient were reviewed by me and considered in my medical decision making (see chart for details).   Patient is a pleasant, nontoxic-appearing, 58 year old female presenting for evaluation of 5 days with right flank pain as outlined HPI above.  The pain does increase with movement though it is not reproducible to palpation.  Patient also has no CVA tenderness on exam.  She does have tenderness with palpation of the right side of her abdomen anteriorly.  She does not have her gallbladder or appendix.  She reports that this pain is similar to when she has had kidney stones in the past.  She has associated urinary urgency but no frequency, dysuria, or hematuria.  I will order a urinalysis to assess for any abnormalities in the urine as well as obtain a KUB to assess for the possibility of kidney stone.  KUB independently reviewed and evaluated by me.  Impression: Stool present in the ascending colon overlapping the path of the right ureter.  No appreciable calculi noted.  There is a calculi present in the [place] pole of the left kidney.  Radiology overread is pending. Radiology impression states no findings to explain right flank pain.  Urinalysis is negative for leukocyte esterase, nitrates, protein, and hemoglobin.  No RBCs noted on reflex microscopy.  I will discharge patient on the diagnosis of musculoskeletal back pain and treat her with 600 mg ibuprofen  every 6 hours with food along with 10 mg baclofen every 8 hours.  Also home physical therapy exercises.  Moist heat to improve blood flow and aid in  relaxation of muscle pain.  Return precautions reviewed.   Final Clinical Impressions(s) / UC Diagnoses   Final diagnoses:  Right flank pain  Acute right-sided thoracic back pain     Discharge Instructions      Take the ibuprofen , 600 mg every 6 hours with food, on a schedule for the next 48 hours and then as needed.  Take the baclofen, 10 mg every 8 hours, on a schedule for the next 48 hours and then as needed.  Apply moist heat to your back for 30 minutes at a time 2-3 times a day to improve blood flow to the area and help remove the lactic acid causing the spasm.  Follow the back exercises given at discharge.  Return for reevaluation for any new or worsening symptoms.    ED Prescriptions     Medication Sig Dispense Auth. Provider   ibuprofen  (ADVIL ) 600 MG tablet Take 1 tablet (600 mg total) by mouth every 6 (six) hours as needed. 30 tablet Kent Pear, NP   baclofen (LIORESAL) 10 MG tablet Take 1 tablet (10 mg total) by mouth 3 (three) times daily. 30 each Kent Pear, NP      PDMP not reviewed this encounter.   Kent Pear, NP 03/18/24 1857    Kent Pear, NP 03/18/24 Roberto Chinchilla

## 2024-03-18 NOTE — Discharge Instructions (Addendum)

## 2024-03-18 NOTE — ED Triage Notes (Signed)
 Pt c/o lower back pain x5 days. States radiates to lower abd at times. Denies any urinary sx's,falls or injuries. Concerned for kidney stone.

## 2024-03-19 ENCOUNTER — Ambulatory Visit (HOSPITAL_COMMUNITY): Payer: Self-pay
